# Patient Record
Sex: Female | Born: 1970
Health system: Southern US, Community
[De-identification: ages and names within clinical notes are randomized; demographics above are authoritative.]

## PROBLEM LIST (undated history)

## (undated) DIAGNOSIS — Z8619 Personal history of other infectious and parasitic diseases: Secondary | ICD-10-CM

## (undated) DIAGNOSIS — J45909 Unspecified asthma, uncomplicated: Secondary | ICD-10-CM

## (undated) DIAGNOSIS — D649 Anemia, unspecified: Secondary | ICD-10-CM

## (undated) DIAGNOSIS — N289 Disorder of kidney and ureter, unspecified: Secondary | ICD-10-CM

## (undated) DIAGNOSIS — R51 Headache: Secondary | ICD-10-CM

## (undated) DIAGNOSIS — E669 Obesity, unspecified: Secondary | ICD-10-CM

## (undated) DIAGNOSIS — T4145XA Adverse effect of unspecified anesthetic, initial encounter: Secondary | ICD-10-CM

## (undated) DIAGNOSIS — R519 Headache, unspecified: Secondary | ICD-10-CM

## (undated) DIAGNOSIS — T8859XA Other complications of anesthesia, initial encounter: Secondary | ICD-10-CM

## (undated) DIAGNOSIS — Z9289 Personal history of other medical treatment: Secondary | ICD-10-CM

## (undated) DIAGNOSIS — I10 Essential (primary) hypertension: Secondary | ICD-10-CM

## (undated) HISTORY — PX: CHOLECYSTECTOMY: SHX55

## (undated) HISTORY — DX: Headache, unspecified: R51.9

## (undated) HISTORY — DX: Anemia, unspecified: D64.9

## (undated) HISTORY — DX: Essential (primary) hypertension: I10

## (undated) HISTORY — PX: ABDOMINAL HYSTERECTOMY: SHX81

## (undated) HISTORY — DX: Personal history of other infectious and parasitic diseases: Z86.19

## (undated) HISTORY — PX: TUBAL LIGATION: SHX77

## (undated) HISTORY — DX: Obesity, unspecified: E66.9

## (undated) HISTORY — PX: HYSTEROSCOPY: SHX211

## (undated) HISTORY — DX: Headache: R51

---

## 1997-06-01 ENCOUNTER — Emergency Department (HOSPITAL_COMMUNITY): Admission: EM | Admit: 1997-06-01 | Discharge: 1997-06-01 | Payer: Self-pay | Admitting: Emergency Medicine

## 1998-02-27 ENCOUNTER — Emergency Department (HOSPITAL_COMMUNITY): Admission: EM | Admit: 1998-02-27 | Discharge: 1998-02-27 | Payer: Self-pay | Admitting: Emergency Medicine

## 1998-05-30 ENCOUNTER — Emergency Department (HOSPITAL_COMMUNITY): Admission: EM | Admit: 1998-05-30 | Discharge: 1998-05-30 | Payer: Self-pay | Admitting: Emergency Medicine

## 1998-05-30 ENCOUNTER — Encounter: Payer: Self-pay | Admitting: Emergency Medicine

## 2000-10-20 ENCOUNTER — Encounter: Payer: Self-pay | Admitting: Emergency Medicine

## 2000-10-20 ENCOUNTER — Emergency Department (HOSPITAL_COMMUNITY): Admission: EM | Admit: 2000-10-20 | Discharge: 2000-10-20 | Payer: Self-pay | Admitting: Emergency Medicine

## 2001-12-25 ENCOUNTER — Emergency Department (HOSPITAL_COMMUNITY): Admission: EM | Admit: 2001-12-25 | Discharge: 2001-12-25 | Payer: Self-pay | Admitting: Emergency Medicine

## 2002-04-08 ENCOUNTER — Ambulatory Visit (HOSPITAL_COMMUNITY): Admission: RE | Admit: 2002-04-08 | Discharge: 2002-04-08 | Payer: Self-pay | Admitting: General Surgery

## 2005-05-08 ENCOUNTER — Inpatient Hospital Stay (HOSPITAL_COMMUNITY): Admission: AD | Admit: 2005-05-08 | Discharge: 2005-05-08 | Payer: Self-pay | Admitting: Gynecology

## 2009-08-21 ENCOUNTER — Emergency Department (HOSPITAL_COMMUNITY): Admission: EM | Admit: 2009-08-21 | Discharge: 2009-08-21 | Payer: Self-pay | Admitting: Emergency Medicine

## 2009-11-13 ENCOUNTER — Ambulatory Visit: Payer: Self-pay | Admitting: Cardiology

## 2010-06-15 NOTE — H&P (Signed)
   NAME:  Destiny Delgado, Destiny Delgado NO.:  [id number]   MEDICAL RECORD NO.:  [id number]                   PATIENT TYPE:   LOCATION:                                       FACILITY:   PHYSICIAN:  Dalia Heading, M.D.               DATE OF BIRTH:  August 23, 1970   DATE OF ADMISSION:  04/08/2002  DATE OF DISCHARGE:                                HISTORY & PHYSICAL   CHIEF COMPLAINT:  Hematochezia, family history of colon carcinoma.   HISTORY OF PRESENT ILLNESS:  The patient is a 39 year old black female who  is referred for a colonoscopy.  Her brother was recently diagnosed with  colon cancer and she has a sister who recently underwent colonoscopy with  polypectomy.  She does have intermittent episodes of hematochezia.  She  denies any lightheadedness, weight loss, fever, abdominal pain, diarrhea, or  melena.  She has never had a colonoscopy.   PAST MEDICAL HISTORY:  1. Leg pain.  2. Urinary tract infections.  3. Extrinsic allergies.   PAST SURGICAL HISTORY:  Unremarkable.   CURRENT MEDICATIONS:  Vicodin, Bactrim, Altace.   ALLERGIES:  No known drug allergies.   REVIEW OF SYSTEMS:  Unremarkable.   PHYSICAL EXAMINATION:  GENERAL:  The patient is a well-developed, well-  nourished black female in no acute distress.  VITAL SIGNS:  She is afebrile and vital signs are stable.  LUNGS:  Clear to auscultation with equal breath sounds bilaterally.  HEART:  Reveals a regular rate and rhythm without S3, S4, or murmurs.  ABDOMEN:  Soft, nontender, nondistended.  No hepatosplenomegaly or masses or  noted.  RECTAL:  Deferred to the procedure.   IMPRESSION:  Hematochezia, family history of colon carcinoma.    PLAN:  The patient is scheduled for a colonoscopy on April 08, 2002.  The  risks and benefits of the procedure including bleeding and perforation were  fully explained to the patient, who gave informed consent.                                               Dalia Heading, M.D.    MAJ/MEDQ  D:  04/01/2002  T:  04/01/2002  Job:  [id number]   cc:   [facility]

## 2012-01-15 ENCOUNTER — Ambulatory Visit (INDEPENDENT_AMBULATORY_CARE_PROVIDER_SITE_OTHER): Payer: BC Managed Care – PPO

## 2012-01-15 ENCOUNTER — Ambulatory Visit (INDEPENDENT_AMBULATORY_CARE_PROVIDER_SITE_OTHER): Payer: BC Managed Care – PPO | Admitting: Obstetrics and Gynecology

## 2012-01-15 ENCOUNTER — Encounter: Payer: Self-pay | Admitting: Obstetrics and Gynecology

## 2012-01-15 ENCOUNTER — Other Ambulatory Visit: Payer: Self-pay

## 2012-01-15 ENCOUNTER — Other Ambulatory Visit: Payer: Self-pay | Admitting: Obstetrics and Gynecology

## 2012-01-15 VITALS — BP 124/76 | Ht 71.0 in | Wt 299.0 lb

## 2012-01-15 DIAGNOSIS — Z8619 Personal history of other infectious and parasitic diseases: Secondary | ICD-10-CM | POA: Insufficient documentation

## 2012-01-15 DIAGNOSIS — N926 Irregular menstruation, unspecified: Secondary | ICD-10-CM

## 2012-01-15 DIAGNOSIS — R519 Headache, unspecified: Secondary | ICD-10-CM | POA: Insufficient documentation

## 2012-01-15 DIAGNOSIS — R5383 Other fatigue: Secondary | ICD-10-CM

## 2012-01-15 DIAGNOSIS — I10 Essential (primary) hypertension: Secondary | ICD-10-CM | POA: Insufficient documentation

## 2012-01-15 LAB — PROLACTIN: Prolactin: 7.1 ng/mL

## 2012-01-15 LAB — FOLLICLE STIMULATING HORMONE: FSH: 6.5 m[IU]/mL

## 2012-01-15 LAB — CBC
HCT: 29.1 % — ABNORMAL LOW (ref 36.0–46.0)
MCH: 25.3 pg — ABNORMAL LOW (ref 26.0–34.0)
MCHC: 31.6 g/dL (ref 30.0–36.0)
MCV: 80.2 fL (ref 78.0–100.0)
RDW: 15.1 % (ref 11.5–15.5)

## 2012-01-15 LAB — THYROID PANEL WITH TSH: T4, Total: 6.8 ug/dL (ref 5.0–12.5)

## 2012-01-15 LAB — POCT URINE PREGNANCY: Preg Test, Ur: NEGATIVE

## 2012-01-15 MED ORDER — NORETHINDRONE ACETATE 5 MG PO TABS: 5.0000 mg | ORAL_TABLET | Freq: Every day | ORAL | Status: DC

## 2012-01-15 MED ORDER — KETOROLAC TROMETHAMINE 30 MG/ML IJ SOLN
30.0000 mg | Freq: Once | INTRAMUSCULAR | Status: AC
  Administered 2012-01-15: 30 mg via INTRAVENOUS

## 2012-01-15 MED ORDER — KETOROLAC TROMETHAMINE 10 MG PO TABS: 10.0000 mg | ORAL_TABLET | Freq: Four times a day (QID) | ORAL | Status: DC | PRN

## 2012-01-15 NOTE — Progress Notes (Signed)
Subjective:     Destiny Delgado is a 41 y.o. woman,No obstetric history on file., who presents for irregular menses referred by Carolyn Stare ( cousin).  Bleeding pattern started January 2013. Prior to January was monthly with normal flow and no BTB. Starting January, after losing father, cycle became irregular every 14 to 20 days, lasting up to 2 weeks, heavy for the whole duration, 1 pad every hour. Clots as large as 4 cm. Dysmenorrhea associated with passing clots, intensity 8/10. New onset also of Dyspareunia and PCB. Had Hysteroscopy with polypectomy and endometrial ablation in March 2013. No improvement. Received blood transfusion x 1 in March. Also had 1 month of BCP without success.Pts cycle has been very irregular and now it has been on for 3 weeks .  Denies any urinary tract symptoms, changes in bowel movements, nausea, vomiting or fever.   Current contraception: BTL  The following portions of the patient's history were reviewed and updated as appropriate: allergies, current medications, past family history, past medical history, past social history and past surgical history.  Review of Systems Pertinent items are noted in HPI.    Objective:    There were no vitals taken for this visit.  Weight:  Wt Readings from Last 1 Encounters:  No data found for Wt    BMI: There is no height or weight on file to calculate BMI.  General Appearance: Alert, appropriate appearance for age. No acute distress HEENT: Grossly normal Neck / Thyroid: Supple, no masses, nodes or enlargement Lungs: clear to auscultation bilaterally Back: No CVA tenderness Cardiovascular: Regular rate and rhythm. S1, S2, no murmur Gastrointestinal: Soft, non-tender, no masses or organomegaly Pelvic Exam: Vulva and vagina appear normal. Heavy bleeding +++ with large clots.Bimanual exam reveals normal uterus and adnexa. Rectovaginal: not indicated  Ultrasound: uterus 9.2 x 5.6 x 5.4 cm            Endometrium 7.71  mm   L ovary 2.02 x 1.33 x 1.29cm   R ovary 5.69 x 3.30 x 2.39 cm   Rt cyst 2.72 cm   Anteverted uterus, normal endometrium, normal left ovary, RTOV simple cyst, no fluid in CDS, normal adenexas.       Assessment:    DUB / menorrhagia refractory to trial of BCP and HSC/ablation   Plan:   CBC, Thyroid Panel with TSH, FSH, Prolactin Pelvic U/S : normal Toradol Inj  30 mg: Toradol 10 mg every 6 hours prn Aygestin 5 -10 mg daily  Obtain op / pathology report: follow-up after results  Reviewed possible options: Nuvaring / bcp, Mirena, robotic hysterectomy  Silverio Lay MD

## 2012-01-15 NOTE — Telephone Encounter (Signed)
Pharmacy didn't r/c rx. Resent by fax.  Pt to call if not able to get.  ld

## 2012-01-27 ENCOUNTER — Telehealth: Payer: Self-pay | Admitting: Obstetrics and Gynecology

## 2012-01-27 NOTE — Telephone Encounter (Signed)
sr pt 

## 2012-01-27 NOTE — Telephone Encounter (Addendum)
Can you please review pt lab work and advise if any recommendations?  Destiny Delgado, CMA  LVM to advise pt that I would call with results Darien Ramus, CMA

## 2012-01-29 DIAGNOSIS — Z9289 Personal history of other medical treatment: Secondary | ICD-10-CM

## 2012-01-29 HISTORY — DX: Personal history of other medical treatment: Z92.89

## 2012-02-03 NOTE — Telephone Encounter (Signed)
Needs follow up appt.

## 2012-02-04 NOTE — Telephone Encounter (Signed)
LVM to advise that per SR lab work was normal - Hemoglobin still shows anemia - TSH normal. Not menopausal. Does need f/u apt with SR  Darien Ramus, CMA

## 2012-02-07 ENCOUNTER — Encounter: Payer: Self-pay | Admitting: Obstetrics and Gynecology

## 2012-02-07 ENCOUNTER — Ambulatory Visit (INDEPENDENT_AMBULATORY_CARE_PROVIDER_SITE_OTHER): Payer: BC Managed Care – PPO | Admitting: Obstetrics and Gynecology

## 2012-02-07 VITALS — BP 114/66 | Ht 71.0 in | Wt 297.0 lb

## 2012-02-07 DIAGNOSIS — N898 Other specified noninflammatory disorders of vagina: Secondary | ICD-10-CM

## 2012-02-07 DIAGNOSIS — N939 Abnormal uterine and vaginal bleeding, unspecified: Secondary | ICD-10-CM

## 2012-02-07 LAB — POCT URINALYSIS DIPSTICK
Glucose, UA: NEGATIVE
Nitrite, UA: NEGATIVE
Urobilinogen, UA: NEGATIVE

## 2012-02-07 MED ORDER — DROSPIRENONE-ETHINYL ESTRADIOL 3-0.03 MG PO TABS: 1.0000 | ORAL_TABLET | Freq: Every day | ORAL | Status: DC

## 2012-02-07 NOTE — Progress Notes (Signed)
Subjective:     Destiny Delgado is a 42 y.o. woman,G4P4, who presents for irregular menses and lab results   Bleeding pattern: pt states still bleeding every day and flow is enough to cover the pad despite Aygestin 10 mg daily.  Labs: TSH, FSH and Prolactin normal. 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   Current contraception: none  The following portions of the patient's history were reviewed and updated as appropriate: allergies, current medications, past family history, past medical history, past social history and past surgical history.  Review of Systems Pertinent items are noted in HPI.    Objective:    BP 114/66  Ht 5\' 11"  (1.803 m)  Wt 297 lb (134.718 kg)  BMI 41.42 kg/m2  LMP 12/05/2011  Weight:  Wt Readings from Last 1 Encounters:  02/07/12 297 lb (134.718 kg)    BMI: Body mass index is 41.42 kg/(m^2).  General Appearance: Alert, appropriate appearance for age. No acute distress  Assessment:   Dysfunctional uterine bleeding  refractory to medical treatment and polypectomy    Plan:   Reviewed 2 options with patient:  Novasure  Procedure reviewed with option of in-office or in-hospital venue. Risks of bleeding, infection and injury to uterus +/- intra-abdominal organs discussed. Benefits include: 94 % success with approximately 30 % amenorrhea and fewer risks than intra-abdominal surgery.  Pre-op instructions not reviewed at this time  Robotically-assisted hysterectomy was reviewed with the patient.   Benefits of the robotic approach include lesser postoperative pain, less blood loss during surgery, reduced risk of injury to other organs due to better visualization with a 3-D HD 10 times magnifying camera, shorter  hospital stay between 0-1 night and rapid recovery with return to daily routine in 2-3 weeks. Although robotically-assisted hysterectomy has a longer operative time than traditional laparotomy, in a patient with good medical history, the benefits usually outweigh the risks.   Risks include bleeding, infection, injury to other organs, need for laparotomy, transient post-operative facial edema, increased risk of pelvic prolapse associated with any hysterectomy as well as earlier  onset of menopause. Preservation or preventative removal of the ovaries was also reviewed and left to the patient's discretion. Finally, the option of supracervical hysterectomy was also discussed with the possible but yet unconfirmed benefits of reduction of pelvic prolapse. If supracervical hysterectomy is performed, Pap smear screening would continue as currently recommended and a small but possible risk of cervical fibroid development is present.  All questions were answered.   The patient desires to discuss with her husband over the weekend and will call back with her decision. She was provided with written documentation.  Face-to-face 45 minutes    .  Silverio Lay MD

## 2012-02-07 NOTE — Addendum Note (Signed)
Addended by: Darien Ramus on: 02/07/2012 02:39 PM   Modules accepted: Orders

## 2012-02-10 ENCOUNTER — Telehealth: Payer: Self-pay

## 2012-02-10 NOTE — Telephone Encounter (Signed)
VM from pt states that she discussed options of surgeries with her husband and have decided that she is wanting hysterectomy, will send to [facility] for scheduling.  Darien Ramus, CMA

## 2012-02-11 ENCOUNTER — Telehealth: Payer: Self-pay | Admitting: Obstetrics and Gynecology

## 2012-02-13 ENCOUNTER — Other Ambulatory Visit: Payer: Self-pay | Admitting: Obstetrics and Gynecology

## 2012-02-17 ENCOUNTER — Encounter (HOSPITAL_COMMUNITY): Payer: Self-pay | Admitting: *Deleted

## 2012-02-17 ENCOUNTER — Telehealth: Payer: Self-pay

## 2012-02-17 ENCOUNTER — Observation Stay (HOSPITAL_COMMUNITY)
Admission: AD | Admit: 2012-02-17 | Discharge: 2012-02-19 | Disposition: A | Discharge status: Home / Self Care | Payer: BC Managed Care – PPO | Source: Ambulatory Visit | Attending: Obstetrics and Gynecology | Admitting: Obstetrics and Gynecology

## 2012-02-17 DIAGNOSIS — N920 Excessive and frequent menstruation with regular cycle: Principal | ICD-10-CM | POA: Insufficient documentation

## 2012-02-17 DIAGNOSIS — D500 Iron deficiency anemia secondary to blood loss (chronic): Secondary | ICD-10-CM | POA: Insufficient documentation

## 2012-02-17 LAB — CBC
MCV: 76.4 fL — ABNORMAL LOW (ref 78.0–100.0)
Platelets: 332 10*3/uL (ref 150–400)
RBC: 3.43 MIL/uL — ABNORMAL LOW (ref 3.87–5.11)
WBC: 9.3 10*3/uL (ref 4.0–10.5)

## 2012-02-17 LAB — COMPREHENSIVE METABOLIC PANEL
ALT: 20 U/L (ref 0–35)
Albumin: 3.4 g/dL — ABNORMAL LOW (ref 3.5–5.2)
Calcium: 8.9 mg/dL (ref 8.4–10.5)
GFR calc Af Amer: 90 mL/min — ABNORMAL LOW (ref >90)
Glucose, Bld: 79 mg/dL (ref 70–99)
Sodium: 137 mEq/L (ref 135–145)
Total Protein: 7 g/dL (ref 6.0–8.3)

## 2012-02-17 MED ORDER — SODIUM CHLORIDE 0.9 % IJ SOLN: 9.0000 mL | INTRAMUSCULAR | Status: DC | PRN

## 2012-02-17 MED ORDER — DEXTROSE 5 % IV SOLN
2.0000 g | INTRAVENOUS | Status: AC
  Filled 2012-02-17: qty 2

## 2012-02-17 MED ORDER — DIPHENHYDRAMINE HCL 50 MG/ML IJ SOLN: 12.5000 mg | Freq: Four times a day (QID) | INTRAMUSCULAR | Status: DC | PRN

## 2012-02-17 MED ORDER — DIPHENHYDRAMINE HCL 12.5 MG/5ML PO ELIX
12.5000 mg | ORAL_SOLUTION | Freq: Four times a day (QID) | ORAL | Status: DC | PRN
  Administered 2012-02-18 (×2): 12.5 mg via ORAL
  Filled 2012-02-17 (×2): qty 5

## 2012-02-17 MED ORDER — TEMAZEPAM 15 MG PO CAPS: 15.0000 mg | ORAL_CAPSULE | Freq: Every evening | ORAL | Status: DC | PRN

## 2012-02-17 MED ORDER — MENTHOL 3 MG MT LOZG: 1.0000 | LOZENGE | OROMUCOSAL | Status: DC | PRN

## 2012-02-17 MED ORDER — ONDANSETRON HCL 4 MG PO TABS
4.0000 mg | ORAL_TABLET | Freq: Four times a day (QID) | ORAL | Status: DC | PRN
  Administered 2012-02-19: 4 mg via ORAL
  Filled 2012-02-17: qty 1

## 2012-02-17 MED ORDER — ONDANSETRON HCL 4 MG/2ML IJ SOLN
4.0000 mg | Freq: Four times a day (QID) | INTRAMUSCULAR | Status: DC | PRN
  Administered 2012-02-18: 4 mg via INTRAVENOUS
  Filled 2012-02-17: qty 2

## 2012-02-17 MED ORDER — ONDANSETRON HCL 4 MG/2ML IJ SOLN: 4.0000 mg | Freq: Four times a day (QID) | INTRAMUSCULAR | Status: DC | PRN

## 2012-02-17 MED ORDER — ESTROGENS CONJUGATED 25 MG IJ SOLR
25.0000 mg | INTRAMUSCULAR | Status: DC
  Administered 2012-02-17 – 2012-02-18 (×5): 25 mg via INTRAVENOUS
  Filled 2012-02-17 (×13): qty 25

## 2012-02-17 MED ORDER — LISINOPRIL 10 MG PO TABS
10.0000 mg | ORAL_TABLET | Freq: Every day | ORAL | Status: DC
  Administered 2012-02-17 – 2012-02-19 (×2): 10 mg via ORAL
  Filled 2012-02-17 (×4): qty 1

## 2012-02-17 MED ORDER — NALOXONE HCL 0.4 MG/ML IJ SOLN: 0.4000 mg | INTRAMUSCULAR | Status: DC | PRN

## 2012-02-17 MED ORDER — IBUPROFEN 600 MG PO TABS
600.0000 mg | ORAL_TABLET | Freq: Four times a day (QID) | ORAL | Status: DC | PRN
  Administered 2012-02-19: 600 mg via ORAL
  Filled 2012-02-17: qty 1

## 2012-02-17 MED ORDER — FENTANYL 10 MCG/ML IV SOLN
INTRAVENOUS | Status: DC
  Administered 2012-02-17: 19:00:00 via INTRAVENOUS
  Administered 2012-02-17: 160 ug via INTRAVENOUS
  Administered 2012-02-18: 20 ug via INTRAVENOUS
  Administered 2012-02-18: 30 ug via INTRAVENOUS
  Administered 2012-02-18: 40 ug via INTRAVENOUS
  Administered 2012-02-19: 30 ug via INTRAVENOUS
  Administered 2012-02-19: 10 ug via INTRAVENOUS
  Filled 2012-02-17: qty 50

## 2012-02-17 MED ORDER — DEXTROSE IN LACTATED RINGERS 5 % IV SOLN
INTRAVENOUS | Status: DC
  Administered 2012-02-17 – 2012-02-19 (×4): via INTRAVENOUS

## 2012-02-17 NOTE — MAU Note (Signed)
CRNA coming to attempt IV start.

## 2012-02-17 NOTE — MAU Note (Signed)
Feeling dizzy and weak.  Been bleeding for "months now". Scheduled for surgery 02/20, hysterectomy.  Been having abd pain.  Called  Office was told to come in.

## 2012-02-17 NOTE — H&P (Signed)
  Reason for admission:   Menorrhagia  History:     Destiny Delgado is a 42 y.o. female, G4P4, presenting today c/o increased vaginal bleeding, dizziness and pelvic pain. Pain is 10/10 and is not relieved with Ibuprofen 800 mg.   Pt awaiting robotic hysterectomy in February. 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   Seen in the office 02/07/12 with c/o  bleeding every day and flow is enough to cover the pad despite Aygestin 10 mg daily. Was given Yasmin to tale TID for 3 days, BID for 3 days followed by daily. Used as prescribed until 2 days ago when she discontinued because of heavy bleeding.   Review of system:    Pertinent items in the HPI Cardiovascular: negative. No SOB. No cough GI: lack of appetite with occasional nausea Urinary: negative Extremities: denies swelling or pain   Past Medical History:   Past Medical History  Diagnosis Date  . Anemia   . Hypertension   . Head ache   . History of chicken pox   . Pelvic kidney     Allergies:   Allergies  Allergen Reactions  . Hydrocodone Nausea And Vomiting    Patient will not take this  . Codeine Rash    Social History:   History   Social History  . Marital Status: Married    Spouse Name: N/A    Number of Children: N/A  . Years of Education: N/A   Social History Main Topics  . Smoking status: Never Smoker   . Smokeless tobacco: Never Used  . Alcohol Use: No  . Drug Use: No  . Sexually Active: Yes -- Female partner(s)    Birth Control/ Protection: Surgical     Comment: BTL   Other Topics Concern  . None   Social History Narrative  . None    Family History:    Family History  Problem Relation Age of Onset  . Stroke Father   . Hypertension Father   . Diabetes Father   . Heart disease Mother   . Breast cancer Mother   . Thyroid disease Mother   . Anemia Mother   . Diabetes Mother   . Diabetes Brother   . Asthma Daughter   . Diabetes  Paternal Aunt   . Diabetes Maternal Aunt   . Diabetes Maternal Uncle   . Diabetes Paternal Uncle     Physical exam:    VS are normal with normal orthostatics General Appearance: Alert, appropriate appearance for age. Appears uncomfortable Neck / Thyroid: Supple, no masses, nodes or enlargement Lungs: clear to auscultation bilaterally Back: No CVA tenderness. Cardiovascular: Regular rate and rhythm. S1, S2, no murmur Gastrointestinal: Soft, non-tender, no masses or organomegaly Pelvic Exam: VV normal. Speculum exam with moderate bleeding and small clots. Uterus is normal size and NT. Adnexa not felt 2nd body habitus Extremities: do evidence of DVT  Ultrasound 01/14/13: essentially normal     Assessment:   Menorrhagia refractory to medical treatment   Plan:    Admit for IV Premarin and pain management

## 2012-02-17 NOTE — MAU Note (Signed)
IV attempt x 2, unsuccessful.  Deetta Perla RN in to attempt placement.

## 2012-02-17 NOTE — Progress Notes (Signed)
MD informed of pt status, ordered CBC & orthostatic VS, is coming to see pt.

## 2012-02-17 NOTE — MAU Note (Signed)
Assisted pt to rm via wc from triage.  Offered help with changing clothes.  Call light in reach.

## 2012-02-17 NOTE — Telephone Encounter (Signed)
Received VM from pt. Pt states that she is bleeding heavily x 1 1/2 weeks, having bad cramping. States that she is having clotting the size of 50 cent piece, changing pad every 20-30 minutes. Feeling extremely wee.   Per SR pt to come to hosp for eval. CBC and possible IV treatment  Pt voiced understanding  Darien Ramus, CMA

## 2012-02-18 ENCOUNTER — Encounter (HOSPITAL_COMMUNITY): Payer: Self-pay | Admitting: Registered Nurse

## 2012-02-18 LAB — CBC
Hemoglobin: 7.4 g/dL — ABNORMAL LOW (ref 12.0–15.0)
MCH: 23.6 pg — ABNORMAL LOW (ref 26.0–34.0)
MCHC: 30.5 g/dL (ref 30.0–36.0)
Platelets: 276 10*3/uL (ref 150–400)
RDW: 15.2 % (ref 11.5–15.5)

## 2012-02-18 LAB — COMPREHENSIVE METABOLIC PANEL
ALT: 18 U/L (ref 0–35)
AST: 13 U/L (ref 0–37)
Alkaline Phosphatase: 71 U/L (ref 39–117)
CO2: 26 mEq/L (ref 19–32)
Chloride: 102 mEq/L (ref 96–112)
GFR calc non Af Amer: 89 mL/min — ABNORMAL LOW (ref >90)
Glucose, Bld: 119 mg/dL — ABNORMAL HIGH (ref 70–99)
Potassium: 4.4 mEq/L (ref 3.5–5.1)
Sodium: 136 mEq/L (ref 135–145)
Total Bilirubin: 0.2 mg/dL — ABNORMAL LOW (ref 0.3–1.2)

## 2012-02-18 LAB — MRSA PCR SCREENING: MRSA by PCR: NEGATIVE

## 2012-02-18 LAB — PREPARE RBC (CROSSMATCH)

## 2012-02-18 MED ORDER — ACETAMINOPHEN 325 MG PO TABS
650.0000 mg | ORAL_TABLET | Freq: Once | ORAL | Status: AC
  Administered 2012-02-18: 650 mg via ORAL
  Filled 2012-02-18: qty 1

## 2012-02-18 MED ORDER — NORETHINDRONE-ETH ESTRADIOL 0.4-35 MG-MCG PO TABS
1.0000 | ORAL_TABLET | Freq: Every day | ORAL | Status: DC
  Administered 2012-02-18 – 2012-02-19 (×2): 1 via ORAL
  Filled 2012-02-18 (×3): qty 1

## 2012-02-18 MED ORDER — FUROSEMIDE 10 MG/ML IJ SOLN
20.0000 mg | Freq: Once | INTRAMUSCULAR | Status: DC
  Filled 2012-02-18: qty 2

## 2012-02-18 MED ORDER — DIPHENHYDRAMINE HCL 50 MG/ML IJ SOLN: 25.0000 mg | Freq: Once | INTRAMUSCULAR | Status: DC

## 2012-02-18 MED ORDER — SODIUM CHLORIDE 0.9 % IV SOLN: 500.0000 mL | Freq: Once | INTRAVENOUS | Status: DC

## 2012-02-18 MED ORDER — IBUPROFEN 600 MG PO TABS: 600.0000 mg | ORAL_TABLET | Freq: Four times a day (QID) | ORAL | Status: DC | PRN

## 2012-02-18 NOTE — Progress Notes (Addendum)
Destiny Delgado is a40 y.o.  [id number]  Hospital Day #2;  Menorrhagia and Severe Anemia  Subjective:   Patient is doing well except for complaints of a "hunger" headache and cramping when it is time to dose her medication.  Pain is well controlled with PCA-Fentanyl.  Voiding and passing flatus.  Felt a little off balance last night upon ambulating to bathroom (with assistance).   Objective: Vital signs in last 24 hours: Temp:  [98.1 F (36.7 C)-99.8 F (37.7 C)] 98.1 F (36.7 C) (01/21 0537) Pulse Rate:  [68-78] 68  (01/21 0537) Resp:  [16-23] 19  (01/21 0537) BP: (104-142)/(59-81) 104/64 mmHg (01/21 0537) SpO2:  [99 %-100 %] 100 % (01/21 0537) Weight:  [297 lb (134.718 kg)] 297 lb (134.718 kg) (01/20 1403)  Intake/Output from previous day: 01/20 0701 - 01/21 0700 In: 470 [P.O.:470] Out: 550 [Urine:550] Intake/Output this shift:    Lab 02/18/12 0535 02/17/12 1436  WBC 8.3 9.3  HGB 7.4* 8.3*  HCT 24.3* 26.2*  PLT 276 332     Lab 02/18/12 0535 02/17/12 1436  NA 136 137  K 4.4 4.4  CL 102 104  CO2 26 25  BUN 8 9  CREATININE 0.81 0.91  CALCIUM 8.6 8.9  PROT 6.6 7.0  BILITOT 0.2* 0.2*  ALKPHOS 71 84  ALT 18 20  AST 13 15  GLUCOSE 119* 79    EXAM: General: alert, cooperative and no distress Resp: clear to auscultation bilaterally Cardio: regular rate and rhythm, S1, S2 normal, no murmur, click, rub or gallop GI: soft, bowel sounds present, diffusely tender without guarding. Extremities: SCD hose in place and functiioning with no calf tenderness. Vaginal Bleeding: a 4 inch stain of blood in pad; no clots   Assessment: Menorrhagia Severe Anemia  Plan: Transfuse 2 Units of PRBCs Continue IV Premarin Routine Care  LOS: 1 day    POWELL,ELMIRA, PA-C 02/18/2012 8:07 AM   Pt feeling better. Nausea resolved. Ate her full meal without difficulty. Pain is also very improved with menstrual bleeding now very light. Will transfuse a total of 3 units and plan  D/C in am

## 2012-02-19 LAB — CBC
Hemoglobin: 9.9 g/dL — ABNORMAL LOW (ref 12.0–15.0)
MCH: 25.8 pg — ABNORMAL LOW (ref 26.0–34.0)
MCHC: 32.8 g/dL (ref 30.0–36.0)
MCV: 78.6 fL (ref 78.0–100.0)
RBC: 3.84 MIL/uL — ABNORMAL LOW (ref 3.87–5.11)

## 2012-02-19 LAB — TYPE AND SCREEN: Antibody Screen: NEGATIVE

## 2012-02-19 MED ORDER — PROMETHAZINE HCL 25 MG PO TABS: 25.0000 mg | ORAL_TABLET | Freq: Four times a day (QID) | ORAL | Status: DC | PRN

## 2012-02-19 MED ORDER — IRON POLYSACCH CMPLX-B12-FA 150-0.025-1 MG PO CAPS: 1.0000 | ORAL_CAPSULE | Freq: Two times a day (BID) | ORAL | Status: DC

## 2012-02-19 MED ORDER — TRAMADOL HCL 50 MG PO TABS
100.0000 mg | ORAL_TABLET | Freq: Four times a day (QID) | ORAL | Status: DC
  Administered 2012-02-19 (×2): 100 mg via ORAL
  Filled 2012-02-19 (×2): qty 2

## 2012-02-19 MED ORDER — NORETHINDRONE-ETH ESTRADIOL 0.4-35 MG-MCG PO TABS: ORAL_TABLET | ORAL | Status: DC

## 2012-02-19 MED ORDER — IBUPROFEN 600 MG PO TABS: ORAL_TABLET | ORAL | Status: DC

## 2012-02-19 MED ORDER — PROMETHAZINE HCL 25 MG PO TABS
25.0000 mg | ORAL_TABLET | Freq: Four times a day (QID) | ORAL | Status: DC | PRN
  Administered 2012-02-19: 25 mg via ORAL
  Filled 2012-02-19: qty 1

## 2012-02-19 MED ORDER — ONDANSETRON HCL 4 MG PO TABS: 4.0000 mg | ORAL_TABLET | Freq: Four times a day (QID) | ORAL | Status: DC | PRN

## 2012-02-19 MED ORDER — HYDROMORPHONE HCL 2 MG PO TABS
2.0000 mg | ORAL_TABLET | ORAL | Status: DC | PRN
  Filled 2012-02-19: qty 1

## 2012-02-19 MED ORDER — TRAMADOL HCL 50 MG PO TABS: ORAL_TABLET | ORAL | Status: DC

## 2012-02-19 MED ORDER — HYDROMORPHONE HCL 2 MG PO TABS: 2.0000 mg | ORAL_TABLET | Freq: Four times a day (QID) | ORAL | Status: DC | PRN

## 2012-02-19 NOTE — Progress Notes (Signed)
Patient discharged home with significant other.  Patient verbalized understanding of discharged instructions.  Prescriptions given and reviewed with patient and significant other.  Patient stated still little dizzy with ambulation, denies any vaginal bleeding at this time, denies any pain and tolerated 1/2 of grilled cheese sandwich and little soup.  Discharged to car via wheelchair.

## 2012-02-19 NOTE — Discharge Summary (Signed)
  Physician Discharge Summary  Patient ID: DELAYNEE ALRED MRN: [id number] DOB/AGE: 1970/05/05 42 y.o.  Admit date: 02/17/2012 Discharge date: 02/19/2012   Discharge Diagnoses:  Menorrhagia and Severe Anemia Active Problems:  * No active hospital problems. *    Discharged Condition: Good and Improved-Stable  Hospital Course: Patient was admitted for menorrhagia that caused her hemoglobin to drop to  7.4 and was accompanied by orthostatic changes.  She was placed on IV Premarin until bleeding subsided and was transfused 3 units of packed red blood cells.  On hospital day #2 she was no longer symptomatic with her anemia, had a hemoglobin of 9.9 and had only scant bleeding. It was at this time however that patient had difficulty managing her pelvic cramping and was intolerant of her oral analgesia.  By hospital day #4  she was able to tolerate Dilaudid and had  achieved the maximum benefit of her hospital stay.  The patient was discharged home on Ovcon 35 daily, Dilaudid  and iron supplementation.   Disposition: Home to self care  Discharge Medications:   Gypsy, Kellogg  Home Medication Instructions [id number]   Printed on:02/19/12 0810  Medication Information                    lisinopril (PRINIVIL,ZESTRIL) 10 MG tablet Take 10 mg by mouth daily.           Multiple Vitamins-Minerals (ALIVE ONCE DAILY WOMENS 50+) TABS Take 1 tablet by mouth daily.           ibuprofen (ADVIL,MOTRIN) 600 MG tablet 1 po pc every 6 hours for 3 days then as needed for pain           norethindrone-ethinyl estradiol (OVCON-35,BALZIVA,BRIELLYN) 0.4-35 MG-MCG tablet 1 active tablet daily (do not take sugar pills) continuously           ondansetron (ZOFRAN) 4 MG tablet Take 1 tablet (4 mg total) by mouth every 6 (six) hours as needed for nausea.           traMADol (ULTRAM) 50 MG tablet 1-2 tablets every 6 hours as needed for severe pain           Iron Polysacch Cmplx-B12-FA 150-0.025-1 MG  CAPS Take 1 capsule by mouth 2 (two) times daily.             Tramadol was replaced by oral Dilaudid due to the patient's intolerance of Tramadol.  Follow-up: March 04, 2012 at 10:30 a.m. with Dr. Estanislado Pandy   Signed: Henreitta Leber, PA-C 02/19/2012, 8:10 AM

## 2012-02-19 NOTE — Progress Notes (Signed)
Ur chart review completed.  

## 2012-02-19 NOTE — Progress Notes (Addendum)
Destiny Delgado is a72 y.o.  [id number]  Post Op Day #2  Subjective: Patient is feeling much better, she says though she still has some cramping.  She reports almost no bleeding at all on her pad, just a faint stain. Denies any more nausea, headache or dizziness with ambulation.  Tolerating a regular diet and is voiding without difficulty.  Had a BM yesterday.  Objective: Vital signs in last 24 hours: Temp:  [97.3 F (36.3 C)-98.8 F (37.1 C)] 97.3 F (36.3 C) (01/22 0536) Pulse Rate:  [60-82] 66  (01/22 0536) Resp:  [16-25] 16  (01/22 0536) BP: (95-117)/(5-78) 107/66 mmHg (01/22 0536) SpO2:  [94 %-100 %] 100 % (01/22 0536) Weight:  [297 lb (134.718 kg)] 297 lb (134.718 kg) (01/21 0813)  Intake/Output from previous day: 01/21 0701 - 01/22 0700 In: 4444.2 [P.O.:240; I.V.:3154.2] Out: 1451 [Urine:1450] Intake/Output this shift:    Lab 02/19/12 0115 02/18/12 0535 02/17/12 1436  WBC 10.6* 8.3 9.3  HGB 9.9* 7.4* 8.3*  HCT 30.2* 24.3* 26.2*  PLT 272 276 332     Lab 02/18/12 0535 02/17/12 1436  NA 136 137  K 4.4 4.4  CL 102 104  CO2 26 25  BUN 8 9  CREATININE 0.81 0.91  CALCIUM 8.6 8.9  PROT 6.6 7.0  BILITOT 0.2* 0.2*  ALKPHOS 71 84  ALT 18 20  AST 13 15  GLUCOSE 119* 79    EXAM: General: alert, cooperative and no distress Resp: clear to auscultation bilaterally Cardio: regular rate and rhythm, S1, S2 normal, no murmur, click, rub or gallop GI: soft, BS present, mild diffuse tenderness without guarding or rebound. Extremities: No calf tenderness Vaginal Bleeding: Scant, faint stain.   Assessment:  Menorrhagia and Severe Anemia (S/P Transfusion 3 U PRBC)  Plan: Discharge home To place on iron supplementation and Ovcon 35 (active pills only) in preparation for surgery in February.  LOS: 2 days    POWELL,ELMIRA, PA-C 02/19/2012 7:51 AM  Called by RN because pt did not tolerate ultram and felt nausea.  Phenergan ordered.  Will do a trial of dilaudid prior to  d/c home and if tolerates it will go home with dilaudid.  Will also give Rx for phenergan.

## 2012-02-19 NOTE — Progress Notes (Signed)
02/19/12 1100  Clinical Encounter Type  Visited With Patient and family together (Husband Christen Bame, who goes by Thayer Ohm)  Visit Type Initial;Spiritual support;Social support  Referral From Nurse  Recommendations Spiritual Care plans to follow up w/ pt on 2/20 (surgery day)  Spiritual Encounters  Spiritual Needs Emotional;Prayer    Missed Tineka yesterday, so had initial visit today with her and husband Thayer Ohm.  They were very receptive to chaplain support and valued opportunity for theological reflection, prayer, and sharing.  Giara is in better spirits with a bit more energy as she awaits discharge.  Provided pastoral presence, listening, witness to family's story, and prayer at bedside.  Will put Liesel's 2/20 surgery on chaplains' calendar for follow-up support.  Family is aware of chaplain availability and welcomes further care.  [address] [place], [place] [phone]

## 2012-02-24 ENCOUNTER — Telehealth: Payer: Self-pay | Admitting: Obstetrics and Gynecology

## 2012-02-24 ENCOUNTER — Telehealth: Payer: Self-pay

## 2012-02-24 ENCOUNTER — Other Ambulatory Visit: Payer: Self-pay | Admitting: Obstetrics and Gynecology

## 2012-02-24 MED ORDER — ONDANSETRON HCL 4 MG PO TABS: 4.0000 mg | ORAL_TABLET | Freq: Three times a day (TID) | ORAL | Status: DC | PRN

## 2012-02-24 MED ORDER — NORETHINDRONE-ETH ESTRADIOL 0.4-35 MG-MCG PO TABS: ORAL_TABLET | ORAL | Status: DC

## 2012-02-24 NOTE — Telephone Encounter (Signed)
TC from pt stating that Rx for Ovcon and Zofran was sent to pharmacy but she is having difficulty getting medication. After speaking with pharmacy [facility] [phone] was advised that they would contact ins regarding getting override for pt Ovcon since pt is needing to take this three times daily. Zofran is needing PA pharmacy will fax over information and I will contact ins for PA  Pt aware Will call pt when all is taken care of  Darien Ramus, CMA

## 2012-02-24 NOTE — Telephone Encounter (Signed)
TC from pharmacy. Override for Ovcon was approved.  Waiting on ins to approve PA. Advised pt this may take up to 72 hours. Pt has questions regarding any OTC meds she can take while waiting.  Advised pt to speak with Pharmacist and see if there is anything they recommend.  Pt voiced understanding  Darien Ramus, CMA

## 2012-02-24 NOTE — Telephone Encounter (Signed)
Zofran PA denied by ins. Per EP call in Phenergan 12.5 mg q6hrs prn # 30 x 0. Per pharmacy pt ins will cover this med.   Pt aware of change.  Pt also is stating that she is needing a letter for her job stating that due to condition it is necessary for her to be out of work intermittently until her surgery. States that she needs this letter to date from last week - surgery date. Needing this faxed to [phone]  Darien Ramus, CMA

## 2012-02-24 NOTE — Telephone Encounter (Signed)
Call from patient who was discharged for [facility] last Thursday due to menorrhagia (transfused 3 units of PRBCs and given IV Premarin) who states that she started bleeding on Friday and has progressively increased since then.  Passing large clots, soiling clothes (especially if she sneezes or coughs) and changes pad every 30 minutes. States that cramping has resumed.  Did not get her medications (birth control pills and pain medicines as she states that her pharmacy didn't have the prescriptions.  States she's had problems with the pharmacy before. She also did not call because she didn't know that there was someone on call at all times and that the office was closed.  Patient states that she was told that she would need a D & C if her bleeding did not stop.  Since she has been on different BCPs before, she doesn't have any faith in the birth control pills stopping her bleeding and wants another option.  To contact Dr. Estanislado Pandy or the M.D. on call for management plan.  Patient was agreeable.  Kamarri Lovvorn, PA-C

## 2012-02-24 NOTE — Telephone Encounter (Signed)
Call back to patient, after consultation with Dr. Pennie Rushing about patient's bleeding, with  the recommendation that the patient  get her BCPs (Ovcon 35) and  take them 3 times a day x 7 days the 1 po daily.  Left voicemail message for patient with these instructions.  New prescription for Ovcon 35 and Zofran 4 mg called to [facility] [place] [facility] [address] per patient's instructions. Gokul Waybright, PA-C    Return call from patient outlining the instructions from above-patient agreeable.  Aradhana Gin, PA-C

## 2012-02-24 NOTE — Telephone Encounter (Signed)
Robotic Hysterectomy scheduled for 03/19/12 @ 8:30 with SR/EP.  BCBS effective 11/29/2011.  Plan pays 80/20 after a $2,000 Deductible. Pre-op due $700; Patient to pay $350 before surgery. -Adrianne Pridgen

## 2012-02-25 ENCOUNTER — Other Ambulatory Visit: Payer: Self-pay | Admitting: Obstetrics and Gynecology

## 2012-02-26 ENCOUNTER — Encounter (HOSPITAL_COMMUNITY)
Admission: AD | Disposition: A | Discharge status: Home / Self Care | Payer: Self-pay | Source: Ambulatory Visit | Attending: Obstetrics and Gynecology

## 2012-02-26 ENCOUNTER — Encounter (HOSPITAL_COMMUNITY): Payer: Self-pay

## 2012-02-26 ENCOUNTER — Encounter (HOSPITAL_COMMUNITY): Payer: Self-pay | Admitting: Anesthesiology

## 2012-02-26 ENCOUNTER — Observation Stay (HOSPITAL_COMMUNITY): Payer: BC Managed Care – PPO | Admitting: Anesthesiology

## 2012-02-26 ENCOUNTER — Ambulatory Visit: Admit: 2012-02-26 | Payer: Self-pay | Admitting: Obstetrics and Gynecology

## 2012-02-26 ENCOUNTER — Encounter: Payer: BC Managed Care – PPO | Admitting: Obstetrics and Gynecology

## 2012-02-26 ENCOUNTER — Ambulatory Visit (HOSPITAL_COMMUNITY)
Admission: AD | Admit: 2012-02-26 | Discharge: 2012-02-26 | Disposition: A | Discharge status: Home / Self Care | Payer: BC Managed Care – PPO | Source: Ambulatory Visit | Attending: Obstetrics and Gynecology | Admitting: Obstetrics and Gynecology

## 2012-02-26 DIAGNOSIS — D649 Anemia, unspecified: Secondary | ICD-10-CM | POA: Insufficient documentation

## 2012-02-26 DIAGNOSIS — I10 Essential (primary) hypertension: Secondary | ICD-10-CM

## 2012-02-26 DIAGNOSIS — N920 Excessive and frequent menstruation with regular cycle: Secondary | ICD-10-CM

## 2012-02-26 DIAGNOSIS — N949 Unspecified condition associated with female genital organs and menstrual cycle: Secondary | ICD-10-CM | POA: Insufficient documentation

## 2012-02-26 DIAGNOSIS — R519 Headache, unspecified: Secondary | ICD-10-CM

## 2012-02-26 DIAGNOSIS — Z8619 Personal history of other infectious and parasitic diseases: Secondary | ICD-10-CM

## 2012-02-26 DIAGNOSIS — N938 Other specified abnormal uterine and vaginal bleeding: Secondary | ICD-10-CM | POA: Insufficient documentation

## 2012-02-26 HISTORY — PX: DILITATION & CURRETTAGE/HYSTROSCOPY WITH THERMACHOICE ABLATION: SHX5569

## 2012-02-26 LAB — CBC
HCT: 31.9 % — ABNORMAL LOW (ref 36.0–46.0)
Hemoglobin: 10 g/dL — ABNORMAL LOW (ref 12.0–15.0)
MCH: 25.2 pg — ABNORMAL LOW (ref 26.0–34.0)
MCHC: 31.3 g/dL (ref 30.0–36.0)
MCV: 80.4 fL (ref 78.0–100.0)
RDW: 16.7 % — ABNORMAL HIGH (ref 11.5–15.5)

## 2012-02-26 LAB — COMPREHENSIVE METABOLIC PANEL
ALT: 18 U/L (ref 0–35)
AST: 13 U/L (ref 0–37)
CO2: 27 mEq/L (ref 19–32)
Calcium: 9 mg/dL (ref 8.4–10.5)
Chloride: 101 mEq/L (ref 96–112)
Creatinine, Ser: 0.9 mg/dL (ref 0.50–1.10)
GFR calc Af Amer: >90 mL/min (ref >90)
GFR calc non Af Amer: 78 mL/min — ABNORMAL LOW (ref >90)
Glucose, Bld: 132 mg/dL — ABNORMAL HIGH (ref 70–99)
Total Bilirubin: 0.3 mg/dL (ref 0.3–1.2)

## 2012-02-26 SURGERY — DILATATION & CURETTAGE/HYSTEROSCOPY WITH THERMACHOICE ABLATION
Anesthesia: General | Site: Vagina | Wound class: Clean Contaminated

## 2012-02-26 MED ORDER — FENTANYL CITRATE 0.05 MG/ML IJ SOLN
INTRAMUSCULAR | Status: AC
  Filled 2012-02-26: qty 2

## 2012-02-26 MED ORDER — DEXAMETHASONE SODIUM PHOSPHATE 4 MG/ML IJ SOLN: 8.0000 mg | Freq: Once | INTRAMUSCULAR | Status: DC | PRN

## 2012-02-26 MED ORDER — ONDANSETRON HCL 4 MG/2ML IJ SOLN: 8.0000 mg | Freq: Once | INTRAMUSCULAR | Status: DC

## 2012-02-26 MED ORDER — ONDANSETRON HCL 4 MG/2ML IJ SOLN
INTRAMUSCULAR | Status: AC
  Filled 2012-02-26: qty 4

## 2012-02-26 MED ORDER — FENTANYL CITRATE 0.05 MG/ML IJ SOLN
INTRAMUSCULAR | Status: DC | PRN
  Administered 2012-02-26: 100 ug via INTRAVENOUS

## 2012-02-26 MED ORDER — MIDAZOLAM HCL 5 MG/5ML IJ SOLN
INTRAMUSCULAR | Status: DC | PRN
  Administered 2012-02-26: 2 mg via INTRAVENOUS

## 2012-02-26 MED ORDER — FENTANYL CITRATE 0.05 MG/ML IJ SOLN
INTRAMUSCULAR | Status: AC
  Administered 2012-02-26: 50 ug via INTRAVENOUS
  Filled 2012-02-26: qty 2

## 2012-02-26 MED ORDER — DEXAMETHASONE SODIUM PHOSPHATE 10 MG/ML IJ SOLN
INTRAMUSCULAR | Status: AC
  Filled 2012-02-26: qty 1

## 2012-02-26 MED ORDER — HYDROMORPHONE HCL 2 MG PO TABS: 2.0000 mg | ORAL_TABLET | ORAL | Status: DC | PRN

## 2012-02-26 MED ORDER — FENTANYL CITRATE 0.05 MG/ML IJ SOLN
25.0000 ug | INTRAMUSCULAR | Status: DC | PRN
  Administered 2012-02-26 (×3): 50 ug via INTRAVENOUS

## 2012-02-26 MED ORDER — SIMETHICONE 80 MG PO CHEW
80.0000 mg | CHEWABLE_TABLET | Freq: Four times a day (QID) | ORAL | Status: DC | PRN
  Filled 2012-02-26: qty 1

## 2012-02-26 MED ORDER — KETOROLAC TROMETHAMINE 30 MG/ML IJ SOLN: 15.0000 mg | Freq: Once | INTRAMUSCULAR | Status: DC | PRN

## 2012-02-26 MED ORDER — LIDOCAINE HCL (CARDIAC) 20 MG/ML IV SOLN
INTRAVENOUS | Status: DC | PRN
  Administered 2012-02-26: 100 mg via INTRAVENOUS

## 2012-02-26 MED ORDER — FENTANYL CITRATE 0.05 MG/ML IJ SOLN
25.0000 ug | INTRAMUSCULAR | Status: DC | PRN
  Administered 2012-02-26 (×4): 50 ug via INTRAVENOUS

## 2012-02-26 MED ORDER — DEXTROSE 5 % IV SOLN
INTRAVENOUS | Status: DC | PRN
  Administered 2012-02-26: 1000 mL

## 2012-02-26 MED ORDER — ONDANSETRON HCL 4 MG/2ML IJ SOLN: 4.0000 mg | Freq: Four times a day (QID) | INTRAMUSCULAR | Status: DC | PRN

## 2012-02-26 MED ORDER — LIDOCAINE HCL 2 % IJ SOLN
INTRAMUSCULAR | Status: AC
  Filled 2012-02-26: qty 20

## 2012-02-26 MED ORDER — KETOROLAC TROMETHAMINE 30 MG/ML IJ SOLN
30.0000 mg | Freq: Four times a day (QID) | INTRAMUSCULAR | Status: DC
  Administered 2012-02-26: 30 mg via INTRAVENOUS

## 2012-02-26 MED ORDER — LACTATED RINGERS IV SOLN
INTRAVENOUS | Status: DC | PRN
  Administered 2012-02-26: 17:00:00 via INTRAVENOUS

## 2012-02-26 MED ORDER — ONDANSETRON HCL 4 MG/2ML IJ SOLN
INTRAMUSCULAR | Status: AC
  Filled 2012-02-26: qty 2

## 2012-02-26 MED ORDER — DEXTROSE IN LACTATED RINGERS 5 % IV SOLN
INTRAVENOUS | Status: DC | PRN
  Administered 2012-02-26: 16:00:00 via INTRAVENOUS

## 2012-02-26 MED ORDER — ONDANSETRON HCL 4 MG/2ML IJ SOLN
INTRAMUSCULAR | Status: DC | PRN
  Administered 2012-02-26: 4 mg via INTRAVENOUS

## 2012-02-26 MED ORDER — ACETAMINOPHEN 10 MG/ML IV SOLN
1000.0000 mg | Freq: Four times a day (QID) | INTRAVENOUS | Status: DC
  Administered 2012-02-26: 1000 mg via INTRAVENOUS
  Filled 2012-02-26: qty 100

## 2012-02-26 MED ORDER — DEXTROSE IN LACTATED RINGERS 5 % IV SOLN
INTRAVENOUS | Status: DC
  Administered 2012-02-26 (×2): via INTRAVENOUS

## 2012-02-26 MED ORDER — PROPOFOL 10 MG/ML IV EMUL
INTRAVENOUS | Status: DC | PRN
  Administered 2012-02-26: 200 mg via INTRAVENOUS

## 2012-02-26 MED ORDER — ESTROGENS CONJUGATED 25 MG IJ SOLR
25.0000 mg | Freq: Once | INTRAMUSCULAR | Status: AC
  Administered 2012-02-26: 25 mg via INTRAVENOUS
  Filled 2012-02-26: qty 25

## 2012-02-26 MED ORDER — SILVER NITRATE-POT NITRATE 75-25 % EX MISC
CUTANEOUS | Status: AC
  Filled 2012-02-26: qty 2

## 2012-02-26 MED ORDER — MIDAZOLAM HCL 2 MG/2ML IJ SOLN
INTRAMUSCULAR | Status: AC
  Filled 2012-02-26: qty 2

## 2012-02-26 MED ORDER — KETOROLAC TROMETHAMINE 30 MG/ML IJ SOLN: 30.0000 mg | Freq: Four times a day (QID) | INTRAMUSCULAR | Status: DC

## 2012-02-26 MED ORDER — PRENATAL MULTIVITAMIN CH: 1.0000 | ORAL_TABLET | Freq: Every day | ORAL | Status: DC

## 2012-02-26 MED ORDER — ONDANSETRON HCL 4 MG PO TABS: 4.0000 mg | ORAL_TABLET | Freq: Four times a day (QID) | ORAL | Status: DC | PRN

## 2012-02-26 MED ORDER — MEPERIDINE HCL 25 MG/ML IJ SOLN: 6.2500 mg | INTRAMUSCULAR | Status: DC | PRN

## 2012-02-26 MED ORDER — ONDANSETRON 8 MG/NS 50 ML IVPB
8.0000 mg | Freq: Once | INTRAVENOUS | Status: AC
  Administered 2012-02-26: 8 mg via INTRAVENOUS
  Filled 2012-02-26: qty 8

## 2012-02-26 MED ORDER — LIDOCAINE HCL (CARDIAC) 20 MG/ML IV SOLN
INTRAVENOUS | Status: AC
  Filled 2012-02-26: qty 5

## 2012-02-26 MED ORDER — KETOROLAC TROMETHAMINE 30 MG/ML IJ SOLN
INTRAMUSCULAR | Status: AC
  Administered 2012-02-26: 30 mg via INTRAVENOUS
  Filled 2012-02-26: qty 1

## 2012-02-26 MED ORDER — PROPOFOL 10 MG/ML IV EMUL
INTRAVENOUS | Status: AC
  Filled 2012-02-26: qty 20

## 2012-02-26 MED ORDER — DEXAMETHASONE SODIUM PHOSPHATE 4 MG/ML IJ SOLN
INTRAMUSCULAR | Status: DC | PRN
  Administered 2012-02-26: 10 mg via INTRAVENOUS

## 2012-02-26 MED ORDER — LIDOCAINE HCL 2 % IJ SOLN
INTRAMUSCULAR | Status: DC | PRN
  Administered 2012-02-26: 20 mL

## 2012-02-26 SURGICAL SUPPLY — 13 items
CANISTER SUCTION 2500CC (MISCELLANEOUS) ×2 IMPLANT
CATH ROBINSON RED A/P 16FR (CATHETERS) ×2 IMPLANT
CATH THERMACHOICE III (CATHETERS) ×2 IMPLANT
CLOTH BEACON ORANGE TIMEOUT ST (SAFETY) ×2 IMPLANT
CONTAINER PREFILL 10% NBF 60ML (FORM) ×4 IMPLANT
DRESSING TELFA 8X3 (GAUZE/BANDAGES/DRESSINGS) ×2 IMPLANT
GLOVE BIO SURGEON STRL SZ 6.5 (GLOVE) ×4 IMPLANT
GOWN STRL REIN XL XLG (GOWN DISPOSABLE) ×4 IMPLANT
PACK HYSTEROSCOPY LF (CUSTOM PROCEDURE TRAY) ×2 IMPLANT
PAD OB MATERNITY 4.3X12.25 (PERSONAL CARE ITEMS) ×2 IMPLANT
SYR 20CC LL (SYRINGE) ×2 IMPLANT
TOWEL OR 17X24 6PK STRL BLUE (TOWEL DISPOSABLE) ×4 IMPLANT
WATER STERILE IRR 1000ML POUR (IV SOLUTION) ×2 IMPLANT

## 2012-02-26 NOTE — Anesthesia Postprocedure Evaluation (Signed)
Anesthesia Post Note  Patient: Destiny Delgado  Procedure(s) Performed: Procedure(s) (LRB): DILATATION & CURETTAGE/HYSTEROSCOPY WITH THERMACHOICE ABLATION (N/A)  Anesthesia type: General  Patient location: PACU  Post pain: Pain level controlled  Post assessment: Post-op Vital signs reviewed  Last Vitals:  Filed Vitals:   02/26/12 1730  BP: 138/82  Pulse: 72  Temp:   Resp: 18    Post vital signs: Reviewed  Level of consciousness: sedated  Complications: No apparent anesthesia complications

## 2012-02-26 NOTE — MAU Note (Signed)
Patient states she has recently been admitted and given a transfusion for excessive vaginal bleeding. States she is scheduled for a hysterectomy on 2-20. State she has been taking BCP's to help the bleeding but started bleeding again on 1-23 and is passing clots and having a lot of cramping.

## 2012-02-26 NOTE — Anesthesia Procedure Notes (Signed)
Procedure Name: LMA Insertion Date/Time: 02/26/2012 4:35 PM Performed by: Bella Brummet, Jannet Askew Pre-anesthesia Checklist: Patient identified, Patient being monitored, Emergency Drugs available, Timeout performed and Suction available Patient Re-evaluated:Patient Re-evaluated prior to inductionOxygen Delivery Method: Circle system utilized Preoxygenation: Pre-oxygenation with 100% oxygen Intubation Type: IV induction Ventilation: Mask ventilation without difficulty LMA: LMA inserted LMA Size: 4.0 Number of attempts: 1 Dental Injury: Teeth and Oropharynx as per pre-operative assessment

## 2012-02-26 NOTE — Anesthesia Preprocedure Evaluation (Signed)
Anesthesia Evaluation  Patient identified by MRN, date of birth, ID band Patient awake    Reviewed: Allergy & Precautions, H&P , NPO status , Patient's Chart, lab work & pertinent test results  Airway Mallampati: III TM Distance: <3 FB Neck ROM: full    Dental No notable dental hx. (+) Teeth Intact   Pulmonary neg pulmonary ROS,    Pulmonary exam normal       Cardiovascular hypertension, Pt. on medications     Neuro/Psych negative psych ROS   GI/Hepatic negative GI ROS, Neg liver ROS,   Endo/Other  Morbid obesity  Renal/GU negative Renal ROS  negative genitourinary   Musculoskeletal negative musculoskeletal ROS (+)   Abdominal (+) + obese,   Peds negative pediatric ROS (+)  Hematology negative hematology ROS (+)   Anesthesia Other Findings   Reproductive/Obstetrics negative OB ROS                           Anesthesia Physical Anesthesia Plan  ASA: III  Anesthesia Plan: General   Post-op Pain Management:    Induction: Intravenous  Airway Management Planned: LMA  Additional Equipment:   Intra-op Plan:   Post-operative Plan:   Informed Consent: I have reviewed the patients History and Physical, chart, labs and discussed the procedure including the risks, benefits and alternatives for the proposed anesthesia with the patient or authorized representative who has indicated his/her understanding and acceptance.     Plan Discussed with: CRNA and Surgeon  Anesthesia Plan Comments: (Patient has OSA by Stop-Bang criteria.)        Anesthesia Quick Evaluation

## 2012-02-26 NOTE — H&P (Signed)
Menorrhagia  History:   Destiny Delgado is a 42 y.o. female, G4P4, presenting today c/o increased vaginal bleeding, dizziness and pelvic pain.pt was hospitlized last week and given IV premarin for DUB.  She is now taking a taper of OCPs.  She has been taking these for three days.  She did not start immediately after discharge as directed.   She has cramping when she passes large clots.  The pt and her husband claims she has been soaking 2-3 pads per hour and bleeding on blood sheets.   Pt awaiting robotic hysterectomy in February. 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  Seen in the office 02/07/12 with c/o bleeding every day and flow is enough to cover the pad despite Aygestin 10 mg daily. Was given Yasmin to tale TID for 3 days, BID for 3 days followed by daily.  Review of system:   Pertinent items in the HPI  Cardiovascular: negative. No SOB. No cough  GI: lack of appetite with occasional nausea  Urinary: negative  Extremities: denies swelling or pain  Past Medical History:    Past Medical History   Diagnosis  Date   .  Anemia    .  Hypertension    .  Head ache    .  History of chicken pox    .  Pelvic kidney     Allergies:    Allergies   Allergen  Reactions   .  Hydrocodone  Nausea And Vomiting     Patient will not take this   .  Codeine  Rash    Social History:    History    Social History   .  Marital Status:  Married     Spouse Name:  N/A     Number of Children:  N/A   .  Years of Education:  N/A    Social History Main Topics   .  Smoking status:  Never Smoker   .  Smokeless tobacco:  Never Used   .  Alcohol Use:  No   .  Drug Use:  No   .  Sexually Active:  Yes -- Female partner(s)     Birth Control/ Protection:  Surgical      Comment: BTL    Other Topics  Concern   .  None    Social History Narrative   .  None    Family History:    Family History   Problem  Relation  Age of Onset   .  Stroke  Father      .  Hypertension  Father    .  Diabetes  Father    .  Heart disease  Mother    .  Breast cancer  Mother    .  Thyroid disease  Mother    .  Anemia  Mother    .  Diabetes  Mother    .  Diabetes  Brother    .  Asthma  Daughter    .  Diabetes  Paternal Aunt    .  Diabetes  Maternal Aunt    .  Diabetes  Maternal Uncle    .  Diabetes  Paternal Uncle    BP 120/70  Pulse 72  Temp 98.3 F (36.8 C) (Oral)  Resp 16  Ht 5\' 7"  (1.702 m)  Wt 296 lb 12 oz (134.605 kg)  BMI 46.48 kg/m2  SpO2 98% Physical Examination: General appearance - alert, well  appearing, and in no distress Chest - clear to auscultation, no wheezes, rales or rhonchi, symmetric air entry Heart - normal rate and regular rhythm Abdomen - soft, nontender, nondistended, no masses or organomegaly Pelvic - normal external genitalia, vulva, vagina, cervix, uterus and adnexa, moderate vaginal bleeding Extremities - Homan's sign negative bilaterally US normal no fibroids seen Results for orders placed during the hospital encounter of 02/26/12  POCT PREGNANCY, URINE      Component Value Range   Preg Test, Ur NEGATIVE  NEGATIVE  CBC      Component Value Range   WBC 9.6  4.0 - 10.5 K/uL   RBC 3.97  3.87 - 5.11 MIL/uL   Hemoglobin 10.0 (*) 12.0 - 15.0 g/dL   HCT 16.1 (*) 09.6 - 04.5 %   MCV 80.4  78.0 - 100.0 fL   MCH 25.2 (*) 26.0 - 34.0 pg   MCHC 31.3  30.0 - 36.0 g/dL   RDW 40.9 (*) 81.1 - 91.4 %   Platelets 255  150 - 400 K/uL  COMPREHENSIVE METABOLIC PANEL      Component Value Range   Sodium    135 - 145 mEq/L   Value: QUESTIONABLE IDENTIFICATION / INCORRECTLY LABELED SPECIMEN   Potassium    3.5 - 5.1 mEq/L   Value: QUESTIONABLE IDENTIFICATION / INCORRECTLY LABELED SPECIMEN   Chloride    96 - 112 mEq/L   Value: QUESTIONABLE IDENTIFICATION / INCORRECTLY LABELED SPECIMEN   CO2    19 - 32 mEq/L   Value: QUESTIONABLE IDENTIFICATION / INCORRECTLY LABELED SPECIMEN   Glucose, Bld    70 - 99 mg/dL   Value: QUESTIONABLE  IDENTIFICATION / INCORRECTLY LABELED SPECIMEN   BUN    6 - 23 mg/dL   Value: QUESTIONABLE IDENTIFICATION / INCORRECTLY LABELED SPECIMEN   Creatinine, Ser    0.50 - 1.10 mg/dL   Value: QUESTIONABLE IDENTIFICATION / INCORRECTLY LABELED SPECIMEN   Calcium    8.4 - 10.5 mg/dL   Value: QUESTIONABLE IDENTIFICATION / INCORRECTLY LABELED SPECIMEN   Total Protein    6.0 - 8.3 g/dL   Value: QUESTIONABLE IDENTIFICATION / INCORRECTLY LABELED SPECIMEN   Albumin    3.5 - 5.2 g/dL   Value: QUESTIONABLE IDENTIFICATION / INCORRECTLY LABELED SPECIMEN   AST    0 - 37 U/L   Value: QUESTIONABLE IDENTIFICATION / INCORRECTLY LABELED SPECIMEN   ALT    0 - 35 U/L   Value: QUESTIONABLE IDENTIFICATION / INCORRECTLY LABELED SPECIMEN   Alkaline Phosphatase    39 - 117 U/L   Value: QUESTIONABLE IDENTIFICATION / INCORRECTLY LABELED SPECIMEN   Total Bilirubin    0.3 - 1.2 mg/dL   Value: QUESTIONABLE IDENTIFICATION / INCORRECTLY LABELED SPECIMEN   GFR calc non Af Amer    >90 mL/min   Value: QUESTIONABLE IDENTIFICATION / INCORRECTLY LABELED SPECIMEN   GFR calc Af Amer    >90 mL/min   Value: QUESTIONABLE IDENTIFICATION / INCORRECTLY LABELED SPECIMEN  COMPREHENSIVE METABOLIC PANEL      Component Value Range   Sodium 135  135 - 145 mEq/L   Potassium 3.7  3.5 - 5.1 mEq/L   Chloride 101  96 - 112 mEq/L   CO2 27  19 - 32 mEq/L   Glucose, Bld 132 (*) 70 - 99 mg/dL   BUN 9  6 - 23 mg/dL   Creatinine, Ser 7.82  0.50 - 1.10 mg/dL   Calcium 9.0  8.4 - 95.6 mg/dL   Total Protein 6.6  6.0 - 8.3  g/dL   Albumin 3.0 (*) 3.5 - 5.2 g/dL   AST 13  0 - 37 U/L   ALT 18  0 - 35 U/L   Alkaline Phosphatase 84  39 - 117 U/L   Total Bilirubin 0.3  0.3 - 1.2 mg/dL   GFR calc non Af Amer 78 (*) >90 mL/min   GFR calc Af Amer >90  >90 mL/min   DUB Anemia Admit for one dose of IV premarin.  Pt has had a hysteroscopy in the past.  She did not have an endometrial ablation.  She was offered [facility] with ablation today.  Pt understands the  risks are bleeding, infection and perforation of the uterus, which could damage other organs.  Pt understands endometrial ablation is 85 % effective and if it fails she may still need a hysterectomy.

## 2012-02-26 NOTE — Op Note (Signed)
Preop Diagnosis: bleeding  DUB  Postop Diagnosis: bleeding DUB  Procedure: DILATATION & CURETTAGE/HYSTEROSCOPY WITH THERMACHOICE ABLATION   Anesthesia: General   Anesthesiologist: No responsible anesthesiologist has been recorded for the case.   Attending: Michael Litter, MD   Assistant: none  Findings: abundant blood and tissue in the uterine cavity.  The ostia was visualized  Pathology: endometrial currettings sent  Fluids:  UOP: minimal  EBL: minimal  Complications: none.    Procedure: The patient was taken to the operating room after risks benefits and alternatives were discussed with patient, the patient verbalized understanding and consent signed and witnessed. The patient was placed under general anesthesia and prepped and draped in normal sterile fashion. A bivalve speculum was placed in the patient's vagina and the anterior lip of the cervix was grasped with a single-tooth tenaculum. The cervix was dilated for passage of the hysteroscope. The uterus sounded to 9 cm.  The hysteroscope was introduced into the uterine cavity with findings as noted above. A curettage was performed and currettings sent to pathology. The hysteroscope was reintroduced and hydrothermal ablation was performed without difficulty. The hysteroscope was placed back into the uterine cavity.  There were no signs of perforation.bilve speculum were removed and there was good hemostasis at the tenaculum sites. Sponge lap and needle count was correct. The patient tolerated procedure well and was returned to the recovery room in good condition. No deficit was noted

## 2012-02-26 NOTE — Transfer of Care (Signed)
Immediate Anesthesia Transfer of Care Note  Patient: Destiny Delgado  Procedure(s) Performed: Procedure(s) (LRB) with comments: DILATATION & CURETTAGE/HYSTEROSCOPY WITH THERMACHOICE ABLATION (N/A)  Patient Location: PACU  Anesthesia Type:General  Level of Consciousness: awake, alert  and sedated  Airway & Oxygen Therapy: Patient connected to nasal cannula oxygen  Post-op Assessment: Report given to PACU RN and Post -op Vital signs reviewed and stable  Post vital signs: Reviewed and stable  Complications: No apparent anesthesia complications

## 2012-02-27 ENCOUNTER — Encounter (HOSPITAL_COMMUNITY): Payer: Self-pay | Admitting: Obstetrics and Gynecology

## 2012-02-27 ENCOUNTER — Encounter: Payer: Self-pay | Admitting: Obstetrics and Gynecology

## 2012-02-27 NOTE — Telephone Encounter (Signed)
Letter printed and faxed to number. Tried to contact pt to advise of letter being faxed. Mailbox full.  Darien Ramus, CMA

## 2012-02-27 NOTE — Telephone Encounter (Signed)
Letter is ready to print and stamp with my signature

## 2012-02-28 NOTE — Progress Notes (Signed)
Phone call to pt .  She says she is cramping.  I told her to take Motrin for the cramping.  The bleeding has decreased.

## 2012-03-03 ENCOUNTER — Telehealth: Payer: Self-pay | Admitting: Obstetrics and Gynecology

## 2012-03-04 ENCOUNTER — Encounter: Payer: Self-pay | Admitting: Obstetrics and Gynecology

## 2012-03-04 ENCOUNTER — Telehealth: Payer: Self-pay | Admitting: Obstetrics and Gynecology

## 2012-03-04 ENCOUNTER — Ambulatory Visit: Payer: BC Managed Care – PPO | Admitting: Obstetrics and Gynecology

## 2012-03-04 VITALS — BP 120/72 | HR 70 | Temp 98.6°F | Resp 16 | Ht 71.0 in | Wt 296.0 lb

## 2012-03-04 DIAGNOSIS — D649 Anemia, unspecified: Secondary | ICD-10-CM

## 2012-03-04 DIAGNOSIS — N920 Excessive and frequent menstruation with regular cycle: Secondary | ICD-10-CM

## 2012-03-04 DIAGNOSIS — Z01818 Encounter for other preprocedural examination: Secondary | ICD-10-CM

## 2012-03-04 MED ORDER — HYDROMORPHONE HCL 2 MG PO TABS: 2.0000 mg | ORAL_TABLET | ORAL | Status: DC | PRN

## 2012-03-04 NOTE — Progress Notes (Signed)
Destiny Delgado is a 42 y.o. female G4P4 who presents for hysterectomy because of relentless dysfunctional uterine bleeding. Patient's menstrual history was a normal monthly moderate flow until February of 2013.  At that time, the  patient began to have constant uterine bleeding of some kind, with 3 weeks being the longest she has not had some bleeding.  This bleeding was accompanied by clots and required the change of a  pad hourly.  Her cramps rated as a 10/10 on a 10 point pain scale were initially responsive to Ibuprofen 800 mg and would decrease intensity to a 5/10.  For the past few months, however, the  patient has only found cramp relief with Dilaudid.  She admits to urinary frequency but denies incontinence, constipation, diarrhea, fever or vaginitis symptoms.  In March of 2013 the patient had hysteroscopy with polypectomy  and blood transfusion but there was no improvement in her bleeding.  She was placed on birth control pills which were also ineffective. A pelvic ultrasound in December 2013 showed a uterus 9.24 x 5.60 x 5.39 cm with left ovary 2.02 x 1.33 x 1.29 cm right ovary 5.69 x 3.30 x 2.39 cm with an ovarian cyst 2.72 x 2.9 x 2.35 cm with a normal thyroid panel and prolactin.  At this point the patient was placed on Aygestin 5-10 mg daily to curtail her bleeding,that had once again become heavy and had lasted for 3 consecutive weeks.  Patient continued to bleed but at a manageable rate until January her bleeding increased such that she required transfusion of 3 units of PRBCs.  She was subsequently prescribed a 35 mcg birth control pill that she failed to start after hospital discharge, and again began to bleed to the point that she underwent a hysteroscopy, with D & C and Thermachoice ablation on February 27, 2012.  Though her bleeding continued daily, it was mostly spotting.  A review of all medical and surgical management options had been given to the patient, with her having a trial of most,   and at this juncture she has decided to proceed with definitive therapy in the form of hysterectomy with the understanding, that due to her pelvic kidney, she will need evaluation by a Urologist to plan for placement of ureteral stents intraoperatively.   Past Medical History  OB History: G4P4 SVD, 1991, 1993, 1994,  and 1995 with largest infant weighing 7lbs. and 7 oz.  GYN History: menarche: 42 YO;    LMP: Bleed continuously;  Contracepton bilateral tubal ligation  The patient denies history of sexually transmitted disease.  Remote  history of abnormal PAP smear as a teenager-normal since;  Last PAP smear: 2013  Medical History: Pelvic kidney, hypertension, anemia and migraine  Surgical History:  1994 Tubal Sterilization; 2005 Cholecystectomy; 2013 Hysteroscopy for Menorrhagia; 2014 Hysteroscopy with D & C and Endometrial Ablation (Thermachoice)  Transfused 2 units of packed red blood cells in 2013 and 4 units of packed red blood cells in 2014-all due to menorrhagia  Family History: Stroke, hypertension, cardiovascular disease, anemia, aneurysm, acid reflux, end stage renal disease and renal cancer  Social History:  Married and 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 HYDROmorphone (DILAUDID) 2 MG tablet Take 1 tablet (2 mg total) by mouth every 4 (four) hours as needed for pain.  30 tablet  0  . ibuprofen (ADVIL,MOTRIN)  800 MG tablet Take 800 mg by mouth every 6 (six) hours as needed. For pain      . lisinopril (PRINIVIL,ZESTRIL) 10 MG tablet Take 10 mg by mouth daily.      . Multiple Vitamins-Minerals (ALIVE ONCE DAILY WOMENS 50+) TABS Take 1 tablet by mouth daily.      . promethazine (PHENERGAN) 25 MG tablet Take 1 tablet (25 mg total) by mouth every 6 (six) hours as needed for nausea.  15 tablet  0    Allergies  Allergen Reactions  .  Hydrocodone Nausea And Vomiting    Patient will not take this  . Ultram (Tramadol) Nausea And Vomiting  . Codeine Rash  Sensitive to shellfish-causes swelling and itching and soy products cause nausea and vomiting  Denies sensitivity to peanuts,  latex or adhesives.  ROS: Admits to headaches twice a month-relieved with Tylenol, nasal congestion with weather changes,  vision changes, and occasional bilateral ankle swelling;  Denies dysphagia, tinnitus, dizziness, hoarseness, cough,  chest pain, shortness of breath, nausea, vomiting, diarrhea,constipation,  urinary frequency, urgency  dysuria, hematuria, vaginitis symptoms, pelvic pain, swelling of joints,easy bruising,  myalgias, arthralgias, skin rashes, unexplained weight loss and except as is mentioned in the history of present illness, patient's review of systems is otherwise negative.  Physical Exam    BP 120/72  Pulse 70  Temp 98.6 F (37 C) (Oral)  Resp 16  Ht 5\' 11"  (1.803 m)  Wt 296 lb (134.265 kg)  BMI 41.28 kg/m2  Neck: supple without masses or thyromegaly Lungs: clear to auscultation Heart: regular rate and rhythm Abdomen: soft, non-tender and no organomegaly Pelvic:EGBUS- wnl; vagina-normal rugae, small blood,  uterus-normal size, cervix without lesions or motion tenderness; (exam limited by habitus),  adnexae-no tenderness or masses Extremities:  no clubbing, cyanosis or edema   Assesment:  Menorrhagia                       Severe Anemia                       S/P Endometrial Ablation   Disposition:  The patient was given the indication for her procedure(s) along with the risks, which include but are not limited to, reaction to anesthesia, damage to adjacent organs, infection, excessive bleeding, formation of scar tissue, early menopause, pelvic prolapse and the possible need for an open abdominal incision. She was further advised that she will experience transient post operative facial edema, that her hospital stay is  expected to be 0-2 days, she should be able to return to her usual activities within 2-3 weeks (except intercourse to be delayed until 6 weeks) and that the robotic approach to her surgery requires more time to perform than an open abdominal approach.  Patient was given the Miralax bowel prep to be completed 24 hours prior to procedure.  She verbalized understanding of these risks and pre-operative instructions and has consented to proceed with a Robot Assisted Total Laparoscopic Hysterectomy with a possible Total Abdominal Hysterectomy at [facility] on March 27, 2012 at 8:30 a.m.  CSN# [id number]   Shakeda Pearse J. Lowell Guitar, PA-C  for Dr.  Crist Fat. Rivard.

## 2012-03-05 NOTE — Telephone Encounter (Signed)
Pt may be out of work for one week after her surgery because of her anemia.  Dr Estanislado Pandy will decide if she needs more time off

## 2012-03-10 ENCOUNTER — Encounter (HOSPITAL_COMMUNITY): Payer: Self-pay

## 2012-03-11 ENCOUNTER — Encounter (HOSPITAL_COMMUNITY): Payer: Self-pay | Admitting: *Deleted

## 2012-03-13 ENCOUNTER — Other Ambulatory Visit (HOSPITAL_COMMUNITY): Payer: BC Managed Care – PPO

## 2012-03-17 ENCOUNTER — Other Ambulatory Visit: Payer: Self-pay | Admitting: Urology

## 2012-03-18 ENCOUNTER — Encounter: Payer: BC Managed Care – PPO | Admitting: Obstetrics and Gynecology

## 2012-03-19 ENCOUNTER — Other Ambulatory Visit: Payer: Self-pay | Admitting: Obstetrics and Gynecology

## 2012-03-19 ENCOUNTER — Encounter (HOSPITAL_COMMUNITY)
Admission: RE | Disposition: A | Discharge status: Home / Self Care | Payer: Self-pay | Source: Ambulatory Visit | Attending: Obstetrics and Gynecology

## 2012-03-19 ENCOUNTER — Ambulatory Visit (HOSPITAL_COMMUNITY)
Admission: RE | Admit: 2012-03-19 | Discharge: 2012-03-20 | Disposition: A | Discharge status: Home / Self Care | Payer: BC Managed Care – PPO | Source: Ambulatory Visit | Attending: Obstetrics and Gynecology | Admitting: Obstetrics and Gynecology

## 2012-03-19 ENCOUNTER — Encounter (HOSPITAL_COMMUNITY): Payer: Self-pay | Admitting: Anesthesiology

## 2012-03-19 ENCOUNTER — Ambulatory Visit (HOSPITAL_COMMUNITY): Payer: BC Managed Care – PPO | Admitting: Anesthesiology

## 2012-03-19 ENCOUNTER — Encounter (HOSPITAL_COMMUNITY): Payer: Self-pay | Admitting: *Deleted

## 2012-03-19 ENCOUNTER — Ambulatory Visit (HOSPITAL_COMMUNITY): Payer: BC Managed Care – PPO

## 2012-03-19 DIAGNOSIS — Z90710 Acquired absence of both cervix and uterus: Secondary | ICD-10-CM

## 2012-03-19 DIAGNOSIS — Q638 Other specified congenital malformations of kidney: Secondary | ICD-10-CM | POA: Insufficient documentation

## 2012-03-19 DIAGNOSIS — N938 Other specified abnormal uterine and vaginal bleeding: Secondary | ICD-10-CM | POA: Insufficient documentation

## 2012-03-19 DIAGNOSIS — I10 Essential (primary) hypertension: Secondary | ICD-10-CM | POA: Insufficient documentation

## 2012-03-19 DIAGNOSIS — Q649 Congenital malformation of urinary system, unspecified: Secondary | ICD-10-CM | POA: Insufficient documentation

## 2012-03-19 DIAGNOSIS — D251 Intramural leiomyoma of uterus: Secondary | ICD-10-CM | POA: Insufficient documentation

## 2012-03-19 DIAGNOSIS — D649 Anemia, unspecified: Secondary | ICD-10-CM | POA: Insufficient documentation

## 2012-03-19 HISTORY — DX: Adverse effect of unspecified anesthetic, initial encounter: T41.45XA

## 2012-03-19 HISTORY — PX: CYSTOSCOPY W/ RETROGRADES: SHX1426

## 2012-03-19 HISTORY — DX: Personal history of other medical treatment: Z92.89

## 2012-03-19 HISTORY — PX: BILATERAL SALPINGECTOMY: SHX5743

## 2012-03-19 HISTORY — PX: ROBOTIC ASSISTED TOTAL HYSTERECTOMY: SHX6085

## 2012-03-19 HISTORY — DX: Other complications of anesthesia, initial encounter: T88.59XA

## 2012-03-19 LAB — CBC
MCH: 25.1 pg — ABNORMAL LOW (ref 26.0–34.0)
MCV: 80 fL (ref 78.0–100.0)
Platelets: 283 10*3/uL (ref 150–400)
RBC: 4.31 MIL/uL (ref 3.87–5.11)
RDW: 17.3 % — ABNORMAL HIGH (ref 11.5–15.5)

## 2012-03-19 SURGERY — ROBOTIC ASSISTED TOTAL HYSTERECTOMY
Anesthesia: General | Site: Urethra | Wound class: Clean Contaminated

## 2012-03-19 MED ORDER — PROPOFOL 10 MG/ML IV EMUL
INTRAVENOUS | Status: DC | PRN
  Administered 2012-03-19: 180 mg via INTRAVENOUS

## 2012-03-19 MED ORDER — DEXAMETHASONE SODIUM PHOSPHATE 10 MG/ML IJ SOLN
INTRAMUSCULAR | Status: DC | PRN
  Administered 2012-03-19: 10 mg via INTRAVENOUS

## 2012-03-19 MED ORDER — ONDANSETRON HCL 4 MG/2ML IJ SOLN
INTRAMUSCULAR | Status: AC
  Filled 2012-03-19: qty 2

## 2012-03-19 MED ORDER — HYDROCODONE-ACETAMINOPHEN 5-300 MG PO TABS: 1.0000 | ORAL_TABLET | ORAL | Status: DC | PRN

## 2012-03-19 MED ORDER — ARTIFICIAL TEARS OP OINT
TOPICAL_OINTMENT | OPHTHALMIC | Status: AC
  Filled 2012-03-19: qty 3.5

## 2012-03-19 MED ORDER — FENTANYL CITRATE 0.05 MG/ML IJ SOLN
INTRAMUSCULAR | Status: DC | PRN
  Administered 2012-03-19: 100 ug via INTRAVENOUS
  Administered 2012-03-19 (×7): 25 ug via INTRAVENOUS
  Administered 2012-03-19: 50 ug via INTRAVENOUS
  Administered 2012-03-19: 25 ug via INTRAVENOUS

## 2012-03-19 MED ORDER — LACTATED RINGERS IV SOLN
INTRAVENOUS | Status: DC
  Administered 2012-03-19 (×3): via INTRAVENOUS

## 2012-03-19 MED ORDER — HYDROCODONE-ACETAMINOPHEN 5-325 MG PO TABS: 1.0000 | ORAL_TABLET | ORAL | Status: DC | PRN

## 2012-03-19 MED ORDER — DIPHENHYDRAMINE HCL 25 MG PO CAPS: 25.0000 mg | ORAL_CAPSULE | Freq: Four times a day (QID) | ORAL | Status: DC | PRN

## 2012-03-19 MED ORDER — IBUPROFEN 600 MG PO TABS: 600.0000 mg | ORAL_TABLET | Freq: Four times a day (QID) | ORAL | Status: DC | PRN

## 2012-03-19 MED ORDER — MIDAZOLAM HCL 5 MG/5ML IJ SOLN
INTRAMUSCULAR | Status: DC | PRN
  Administered 2012-03-19: 2 mg via INTRAVENOUS

## 2012-03-19 MED ORDER — ROCURONIUM BROMIDE 100 MG/10ML IV SOLN
INTRAVENOUS | Status: DC | PRN
  Administered 2012-03-19: 10 mg via INTRAVENOUS
  Administered 2012-03-19 (×2): 20 mg via INTRAVENOUS
  Administered 2012-03-19 (×2): 10 mg via INTRAVENOUS
  Administered 2012-03-19: 40 mg via INTRAVENOUS

## 2012-03-19 MED ORDER — IOHEXOL 300 MG/ML  SOLN
INTRAMUSCULAR | Status: DC | PRN
  Administered 2012-03-19: 70 mL

## 2012-03-19 MED ORDER — LACTATED RINGERS IV SOLN
INTRAVENOUS | Status: DC
  Administered 2012-03-19: 21:00:00 via INTRAVENOUS

## 2012-03-19 MED ORDER — MENTHOL 3 MG MT LOZG: 1.0000 | LOZENGE | OROMUCOSAL | Status: DC | PRN

## 2012-03-19 MED ORDER — FENTANYL CITRATE 0.05 MG/ML IJ SOLN
INTRAMUSCULAR | Status: AC
  Filled 2012-03-19: qty 5

## 2012-03-19 MED ORDER — NEOSTIGMINE METHYLSULFATE 1 MG/ML IJ SOLN
INTRAMUSCULAR | Status: DC | PRN
  Administered 2012-03-19 (×2): 2 mg via INTRAVENOUS

## 2012-03-19 MED ORDER — ONDANSETRON HCL 4 MG/2ML IJ SOLN
INTRAMUSCULAR | Status: DC | PRN
  Administered 2012-03-19: 4 mg via INTRAVENOUS

## 2012-03-19 MED ORDER — MIDAZOLAM HCL 2 MG/2ML IJ SOLN
INTRAMUSCULAR | Status: AC
  Filled 2012-03-19: qty 2

## 2012-03-19 MED ORDER — SCOPOLAMINE 1 MG/3DAYS TD PT72
1.0000 | MEDICATED_PATCH | TRANSDERMAL | Status: DC
  Administered 2012-03-19: 1.5 mg via TRANSDERMAL

## 2012-03-19 MED ORDER — LISINOPRIL 10 MG PO TABS
10.0000 mg | ORAL_TABLET | Freq: Every day | ORAL | Status: DC
  Administered 2012-03-20: 10 mg via ORAL
  Filled 2012-03-19 (×2): qty 1

## 2012-03-19 MED ORDER — ROCURONIUM BROMIDE 50 MG/5ML IV SOLN
INTRAVENOUS | Status: AC
  Filled 2012-03-19: qty 2

## 2012-03-19 MED ORDER — KETOROLAC TROMETHAMINE 30 MG/ML IJ SOLN: 15.0000 mg | Freq: Once | INTRAMUSCULAR | Status: DC | PRN

## 2012-03-19 MED ORDER — HYDROMORPHONE HCL PF 1 MG/ML IJ SOLN: 2.0000 mg | Freq: Once | INTRAMUSCULAR | Status: AC

## 2012-03-19 MED ORDER — LIDOCAINE HCL (CARDIAC) 20 MG/ML IV SOLN
INTRAVENOUS | Status: DC | PRN
  Administered 2012-03-19: 50 mg via INTRAVENOUS

## 2012-03-19 MED ORDER — LACTATED RINGERS IR SOLN
Status: DC | PRN
  Administered 2012-03-19: 3000 mL

## 2012-03-19 MED ORDER — FENTANYL CITRATE 0.05 MG/ML IJ SOLN
INTRAMUSCULAR | Status: AC
  Filled 2012-03-19: qty 2

## 2012-03-19 MED ORDER — IOHEXOL 300 MG/ML  SOLN
30.0000 mL | Freq: Once | INTRAMUSCULAR | Status: AC | PRN
  Administered 2012-03-19: 30 mL via URETHRAL

## 2012-03-19 MED ORDER — BUPIVACAINE HCL (PF) 0.25 % IJ SOLN
INTRAMUSCULAR | Status: DC | PRN
  Administered 2012-03-19: 17 mL

## 2012-03-19 MED ORDER — DEXTROSE 5 % IV SOLN
2.0000 g | INTRAVENOUS | Status: AC
  Administered 2012-03-19: 2 g via INTRAVENOUS
  Filled 2012-03-19: qty 2

## 2012-03-19 MED ORDER — DEXAMETHASONE SODIUM PHOSPHATE 10 MG/ML IJ SOLN
INTRAMUSCULAR | Status: AC
  Filled 2012-03-19: qty 1

## 2012-03-19 MED ORDER — IBUPROFEN 600 MG PO TABS
600.0000 mg | ORAL_TABLET | Freq: Four times a day (QID) | ORAL | Status: DC | PRN
  Administered 2012-03-20 (×2): 600 mg via ORAL
  Filled 2012-03-19 (×2): qty 1

## 2012-03-19 MED ORDER — ACETAMINOPHEN 10 MG/ML IV SOLN
INTRAVENOUS | Status: AC
  Administered 2012-03-19: 1000 mg via INTRAVENOUS
  Filled 2012-03-19: qty 100

## 2012-03-19 MED ORDER — HYDROMORPHONE HCL PF 1 MG/ML IJ SOLN
INTRAMUSCULAR | Status: AC
  Filled 2012-03-19: qty 1

## 2012-03-19 MED ORDER — GLYCOPYRROLATE 0.2 MG/ML IJ SOLN
INTRAMUSCULAR | Status: DC | PRN
  Administered 2012-03-19 (×2): 0.4 mg via INTRAVENOUS

## 2012-03-19 MED ORDER — ACETAMINOPHEN 10 MG/ML IV SOLN
1000.0000 mg | Freq: Once | INTRAVENOUS | Status: DC
  Filled 2012-03-19: qty 100

## 2012-03-19 MED ORDER — FENTANYL CITRATE 0.05 MG/ML IJ SOLN
INTRAMUSCULAR | Status: AC
Start: 2012-03-19 — End: 2012-03-19
  Administered 2012-03-19: 50 ug via INTRAVENOUS
  Filled 2012-03-19: qty 2

## 2012-03-19 MED ORDER — KETOROLAC TROMETHAMINE 30 MG/ML IJ SOLN
INTRAMUSCULAR | Status: AC
  Administered 2012-03-19: 30 mg via INTRAVENOUS
  Filled 2012-03-19: qty 1

## 2012-03-19 MED ORDER — HYDROMORPHONE HCL 2 MG PO TABS
2.0000 mg | ORAL_TABLET | ORAL | Status: DC | PRN
  Administered 2012-03-19 – 2012-03-20 (×4): 2 mg via ORAL
  Filled 2012-03-19 (×4): qty 1

## 2012-03-19 MED ORDER — ACETAMINOPHEN 10 MG/ML IV SOLN
1000.0000 mg | Freq: Once | INTRAVENOUS | Status: AC | PRN
  Administered 2012-03-19: 1000 mg via INTRAVENOUS
  Filled 2012-03-19: qty 100

## 2012-03-19 MED ORDER — LIDOCAINE HCL (CARDIAC) 20 MG/ML IV SOLN
INTRAVENOUS | Status: AC
  Filled 2012-03-19: qty 5

## 2012-03-19 MED ORDER — FENTANYL CITRATE 0.05 MG/ML IJ SOLN
25.0000 ug | INTRAMUSCULAR | Status: DC | PRN
  Administered 2012-03-19 (×3): 50 ug via INTRAVENOUS

## 2012-03-19 MED ORDER — PROPOFOL 10 MG/ML IV EMUL
INTRAVENOUS | Status: AC
  Filled 2012-03-19: qty 20

## 2012-03-19 MED ORDER — STERILE WATER FOR IRRIGATION IR SOLN
Status: DC | PRN
  Administered 2012-03-19: 3000 mL via INTRAVESICAL

## 2012-03-19 MED ORDER — HYDROMORPHONE HCL PF 1 MG/ML IJ SOLN
INTRAMUSCULAR | Status: AC
  Administered 2012-03-19: 2 mg via SUBCUTANEOUS
  Filled 2012-03-19: qty 2

## 2012-03-19 MED ORDER — ONDANSETRON HCL 4 MG PO TABS: 4.0000 mg | ORAL_TABLET | Freq: Three times a day (TID) | ORAL | Status: DC | PRN

## 2012-03-19 MED ORDER — SCOPOLAMINE 1 MG/3DAYS TD PT72
MEDICATED_PATCH | TRANSDERMAL | Status: AC
  Administered 2012-03-19: 1.5 mg via TRANSDERMAL
  Filled 2012-03-19: qty 1

## 2012-03-19 MED ORDER — ONDANSETRON HCL 4 MG/2ML IJ SOLN
4.0000 mg | INTRAMUSCULAR | Status: DC | PRN
  Administered 2012-03-19: 4 mg via INTRAVENOUS
  Filled 2012-03-19: qty 2

## 2012-03-19 MED ORDER — ACETAMINOPHEN 10 MG/ML IV SOLN
1000.0000 mg | Freq: Once | INTRAVENOUS | Status: AC
  Administered 2012-03-19: 1000 mg via INTRAVENOUS

## 2012-03-19 MED ORDER — HYDROMORPHONE HCL PF 1 MG/ML IJ SOLN
INTRAMUSCULAR | Status: AC
  Administered 2012-03-19: 2 mg via SUBCUTANEOUS
  Filled 2012-03-19: qty 1

## 2012-03-19 MED ORDER — KETOROLAC TROMETHAMINE 30 MG/ML IJ SOLN
30.0000 mg | Freq: Four times a day (QID) | INTRAMUSCULAR | Status: DC
  Administered 2012-03-19: 30 mg via INTRAVENOUS
  Filled 2012-03-19: qty 1

## 2012-03-19 MED ORDER — ONDANSETRON HCL 4 MG PO TABS: 4.0000 mg | ORAL_TABLET | Freq: Three times a day (TID) | ORAL | Status: DC

## 2012-03-19 MED ORDER — MUPIROCIN 2 % EX OINT
TOPICAL_OINTMENT | CUTANEOUS | Status: AC
  Filled 2012-03-19: qty 22

## 2012-03-19 MED ORDER — KETOROLAC TROMETHAMINE 30 MG/ML IJ SOLN: 30.0000 mg | Freq: Once | INTRAMUSCULAR | Status: AC

## 2012-03-19 MED ORDER — ZOLPIDEM TARTRATE 5 MG PO TABS: 5.0000 mg | ORAL_TABLET | Freq: Every evening | ORAL | Status: DC | PRN

## 2012-03-19 MED ORDER — BUPIVACAINE HCL (PF) 0.25 % IJ SOLN
INTRAMUSCULAR | Status: AC
  Filled 2012-03-19: qty 30

## 2012-03-19 MED ORDER — FENTANYL CITRATE 0.05 MG/ML IJ SOLN
INTRAMUSCULAR | Status: AC
  Administered 2012-03-19: 50 ug via INTRAVENOUS
  Filled 2012-03-19: qty 2

## 2012-03-19 SURGICAL SUPPLY — 70 items
BAG URINE DRAINAGE (UROLOGICAL SUPPLIES) ×8 IMPLANT
BARRIER ADHS 3X4 INTERCEED (GAUZE/BANDAGES/DRESSINGS) ×4 IMPLANT
BENZOIN TINCTURE PRP APPL 2/3 (GAUZE/BANDAGES/DRESSINGS) ×4 IMPLANT
CATH FOLEY 3WAY  5CC 16FR (CATHETERS) ×2
CATH FOLEY 3WAY  5CC 18FR (CATHETERS) ×1
CATH FOLEY 3WAY 5CC 16FR (CATHETERS) ×6 IMPLANT
CATH FOLEY 3WAY 5CC 18FR (CATHETERS) ×3 IMPLANT
CHLORAPREP W/TINT 26ML (MISCELLANEOUS) ×4 IMPLANT
CLOTH BEACON ORANGE TIMEOUT ST (SAFETY) ×4 IMPLANT
CONT PATH 16OZ SNAP LID 3702 (MISCELLANEOUS) ×4 IMPLANT
CORDS BIPOLAR (ELECTRODE) IMPLANT
COVER LIGHT HANDLE  1/PK (MISCELLANEOUS) ×2
COVER LIGHT HANDLE 1/PK (MISCELLANEOUS) ×6 IMPLANT
COVER MAYO STAND STRL (DRAPES) ×4 IMPLANT
COVER TABLE BACK 60X90 (DRAPES) ×8 IMPLANT
COVER TIP SHEARS 8 DVNC (MISCELLANEOUS) ×3 IMPLANT
COVER TIP SHEARS 8MM DA VINCI (MISCELLANEOUS) ×1
DECANTER SPIKE VIAL GLASS SM (MISCELLANEOUS) ×4 IMPLANT
DERMABOND ADVANCED (GAUZE/BANDAGES/DRESSINGS) ×1
DERMABOND ADVANCED .7 DNX12 (GAUZE/BANDAGES/DRESSINGS) ×3 IMPLANT
DRAPE HUG U DISPOSABLE (DRAPE) ×4 IMPLANT
DRAPE LG THREE QUARTER DISP (DRAPES) ×8 IMPLANT
DRAPE WARM FLUID 44X44 (DRAPE) ×4 IMPLANT
ELECT REM PT RETURN 9FT ADLT (ELECTROSURGICAL) ×4
ELECTRODE REM PT RTRN 9FT ADLT (ELECTROSURGICAL) ×3 IMPLANT
EVACUATOR SMOKE 8.L (FILTER) ×4 IMPLANT
GAUZE VASELINE 3X9 (GAUZE/BANDAGES/DRESSINGS) ×8 IMPLANT
GLOVE BIOGEL PI IND STRL 7.0 (GLOVE) ×9 IMPLANT
GLOVE BIOGEL PI INDICATOR 7.0 (GLOVE) ×3
GLOVE ECLIPSE 6.5 STRL STRAW (GLOVE) ×12 IMPLANT
GOWN STRL REIN XL XLG (GOWN DISPOSABLE) ×40 IMPLANT
GRASPER BIPOLAR FEN DA VINCI (INSTRUMENTS)
GRASPER BPLR FEN DVNC (INSTRUMENTS) IMPLANT
GUIDEWIRE STR DUAL SENSOR (WIRE) ×4 IMPLANT
KIT ACCESSORY DA VINCI DISP (KITS) ×1
KIT ACCESSORY DVNC DISP (KITS) ×3 IMPLANT
LEGGING LITHOTOMY PAIR STRL (DRAPES) ×4 IMPLANT
NEEDLE HYPO 22GX1.5 SAFETY (NEEDLE) ×4 IMPLANT
OCCLUDER COLPOPNEUMO (BALLOONS) IMPLANT
PACK LAVH (CUSTOM PROCEDURE TRAY) ×4 IMPLANT
PAD PREP 24X48 CUFFED NSTRL (MISCELLANEOUS) ×8 IMPLANT
PLUG CATH AND CAP STER (CATHETERS) ×4 IMPLANT
PROTECTOR NERVE ULNAR (MISCELLANEOUS) ×8 IMPLANT
SET CYSTO W/LG BORE CLAMP LF (SET/KITS/TRAYS/PACK) IMPLANT
SET IRRIG TUBING LAPAROSCOPIC (IRRIGATION / IRRIGATOR) ×4 IMPLANT
SOLUTION ELECTROLUBE (MISCELLANEOUS) ×4 IMPLANT
STRIP CLOSURE SKIN 1/2X4 (GAUZE/BANDAGES/DRESSINGS) ×4 IMPLANT
SUT MNCRL AB 3-0 PS2 27 (SUTURE) IMPLANT
SUT VIC AB 0 CT1 27 (SUTURE) ×6
SUT VIC AB 0 CT1 27XBRD ANBCTR (SUTURE) ×6 IMPLANT
SUT VIC AB 0 CT1 27XBRD ANTBC (SUTURE) ×12 IMPLANT
SUT VICRYL 0 UR6 27IN ABS (SUTURE) ×8 IMPLANT
SYR 50ML LL SCALE MARK (SYRINGE) ×4 IMPLANT
SYSTEM CONVERTIBLE TROCAR (TROCAR) ×4 IMPLANT
TIP RUMI ORANGE 6.7MMX12CM (TIP) IMPLANT
TIP UTERINE 5.1X6CM LAV DISP (MISCELLANEOUS) IMPLANT
TIP UTERINE 6.7X10CM GRN DISP (MISCELLANEOUS) IMPLANT
TIP UTERINE 6.7X6CM WHT DISP (MISCELLANEOUS) IMPLANT
TIP UTERINE 6.7X8CM BLUE DISP (MISCELLANEOUS) ×4 IMPLANT
TOWEL OR 17X24 6PK STRL BLUE (TOWEL DISPOSABLE) ×12 IMPLANT
TROCAR 12M 150ML BLUNT (TROCAR) ×4 IMPLANT
TROCAR 5M 150ML BLDLS (TROCAR) ×4 IMPLANT
TROCAR DISP BLADELESS 8 DVNC (TROCAR) ×3 IMPLANT
TROCAR DISP BLADELESS 8MM (TROCAR) ×1
TROCAR XCEL 12X100 BLDLESS (ENDOMECHANICALS) ×4 IMPLANT
TUBING FILTER THERMOFLATOR (ELECTROSURGICAL) ×4 IMPLANT
TUBING NON-CON 1/4 X 20 CONN (TUBING) ×4 IMPLANT
WARMER LAPAROSCOPE (MISCELLANEOUS) ×4 IMPLANT
WATER STERILE IRR 1000ML POUR (IV SOLUTION) ×12 IMPLANT
YANKAUER SUCT BULB TIP NO VENT (SUCTIONS) ×4 IMPLANT

## 2012-03-19 NOTE — Anesthesia Preprocedure Evaluation (Addendum)
Anesthesia Evaluation  Patient identified by MRN, date of birth, ID band Patient awake    Reviewed: Allergy & Precautions, H&P , NPO status , Patient's Chart, lab work & pertinent test results, reviewed documented beta blocker date and time   History of Anesthesia Complications (+) PONV and PROLONGED EMERGENCE  Airway Mallampati: III TM Distance: >3 FB Neck ROM: full  Mouth opening: Limited Mouth Opening  Dental  (+)    Pulmonary neg pulmonary ROS,  breath sounds clear to auscultation  Pulmonary exam normal       Cardiovascular Exercise Tolerance: Good hypertension, On Medications Rhythm:regular Rate:Normal     Neuro/Psych negative neurological ROS  negative psych ROS   GI/Hepatic Neg liver ROS, Bowel prep,  Endo/Other  Morbid obesity  Renal/GU Pelvic kidney  Female GU complaint (DUB)     Musculoskeletal   Abdominal Normal abdominal exam  (+)   Peds  Hematology  (+) Blood dyscrasia (h/o blood transfusion last month), anemia ,   Anesthesia Other Findings   Reproductive/Obstetrics negative OB ROS                          Anesthesia Physical Anesthesia Plan  ASA: III  Anesthesia Plan: General ETT   Post-op Pain Management:    Induction:   Airway Management Planned:   Additional Equipment:   Intra-op Plan:   Post-operative Plan:   Informed Consent: I have reviewed the patients History and Physical, chart, labs and discussed the procedure including the risks, benefits and alternatives for the proposed anesthesia with the patient or authorized representative who has indicated his/her understanding and acceptance.   Dental Advisory Given  Plan Discussed with: CRNA and Surgeon  Anesthesia Plan Comments:        Anesthesia Quick Evaluation

## 2012-03-19 NOTE — Discharge Summary (Signed)
  Physician Discharge Summary  Patient ID: Destiny Delgado MRN: [id number] DOB/AGE: 04/19/1970 42 y.o.  Admit date: 03/19/2012 Discharge date: 03/19/2012   Discharge Diagnoses: Dysfunctional Uterine Bleeding and Pelvic Kidney Active Problems:   * No active hospital problems. *   Operation: Robot Assisted Total Laparoscopic Hysterectomy with Bilateral Salpingectomy,  Placement and Removal of Lighted Ureteral Stents and Cystoscopy   Discharged Condition:  Stable  Hospital Course: On the date of admission the patient underwent the aforementioned procedures, tolerating them well.  Post operative course was unremarkable with the patient resuming bowel and bladder function and adequate analgesia on post operative day #1  and was therefore deemed ready for discharge home.  Disposition: 01-Home or Self Care  Discharge Medications:    Medication List    STOP taking these medications       ciprofloxacin 500 MG tablet  Commonly known as:  CIPRO     HYDROmorphone 2 MG tablet  Commonly known as:  DILAUDID      TAKE these medications       ALIVE ONCE DAILY WOMENS 50+ Tabs  Take 1 tablet by mouth daily.     Hydrocodone-Acetaminophen 5-300 MG Tabs  Take 1-2 tablets by mouth every 4 (four) hours as needed.     ibuprofen 600 MG tablet  Commonly known as:  ADVIL,MOTRIN  Take 1 tablet (600 mg total) by mouth every 6 (six) hours as needed for pain (must take with food as directed x 5 days then prn).     lisinopril 10 MG tablet  Commonly known as:  PRINIVIL,ZESTRIL  Take 10 mg by mouth daily.     ondansetron 4 MG tablet  Commonly known as:  ZOFRAN  Take 1 tablet (4 mg total) by mouth every 8 (eight) hours as needed for nausea.         Follow-up: Dr.Rivard, April 01, 2012 at 10:40 a.m.  SignedHenreitta Leber, PA-C 03/19/2012, 1:58 PM

## 2012-03-19 NOTE — Brief Op Note (Signed)
03/19/2012  10:54 PM  PATIENT:  Destiny Delgado  43 y.o. female  PRE-OPERATIVE DIAGNOSIS:  ; CPT [id number]; S2900Dysfunctional Uterine Bleeding  POST-OPERATIVE DIAGNOSIS:  Dysfunctional Uterine Bleeding, left pelvic kidney  PROCEDURE:  Procedure(s) with comments: ROBOTIC ASSISTED TOTAL HYSTERECTOMY (N/A) CYSTOSCOPY WITH RETROGRADE PYELOGRAM (Bilateral) - with lighted bil stents BILATERAL SALPINGECTOMY (Bilateral) - robotic assisted   SURGEON:  Surgeon(s) and Role: Panel 1:    * Esmeralda Arthur, MD - Primary  Panel 2:    * Kathi Ludwig, MD - Primary  PHYSICIAN ASSISTANT:   ASSISTANTS: Henreitta Leber PA-C   ANESTHESIA:   general Dr Rodman Pickle  EBL:  Total I/O In: -  Out: 450 [Urine:400; Emesis/NG output:50]  BLOOD ADMINISTERED:none  DRAINS: none   LOCAL MEDICATIONS USED:  MARCAINE     SPECIMEN:  Source of Specimen:  uterus and tubes  DISPOSITION OF SPECIMEN:  PATHOLOGY  COUNTS:  YES  DICTATION: .Other Dictation: Dictation Number [id number]  PLAN OF CARE: Admit for overnight observation  PATIENT DISPOSITION:  PACU - hemodynamically stable.

## 2012-03-19 NOTE — Anesthesia Postprocedure Evaluation (Signed)
  Anesthesia Post Note  Patient: Destiny Delgado  Procedure(s) Performed: Procedure(s) (LRB): ROBOTIC ASSISTED TOTAL HYSTERECTOMY (N/A) CYSTOSCOPY WITH RETROGRADE PYELOGRAM (Bilateral) BILATERAL SALPINGECTOMY (Bilateral)  Anesthesia type: GA  Patient location: PACU  Post pain: Pain level controlled  Post assessment: Post-op Vital signs reviewed  Last Vitals:  Filed Vitals:   03/19/12 1400  BP:   Pulse:   Temp: 36.5 C  Resp:     Post vital signs: Reviewed  Level of consciousness: sedated  Complications: No apparent anesthesia complications

## 2012-03-19 NOTE — Transfer of Care (Signed)
Immediate Anesthesia Transfer of Care Note  Patient: Destiny Delgado  Procedure(s) Performed: Procedure(s) with comments: ROBOTIC ASSISTED TOTAL HYSTERECTOMY (N/A) CYSTOSCOPY WITH RETROGRADE PYELOGRAM (Bilateral) - with lighted bil stents BILATERAL SALPINGECTOMY (Bilateral) - robotic assisted   Patient Location: PACU  Anesthesia Type:General  Level of Consciousness: sedated  Airway & Oxygen Therapy: Patient Spontanous Breathing and Patient connected to nasal cannula oxygen  Post-op Assessment: Report given to PACU RN  Post vital signs: Reviewed and stable  Complications: No apparent anesthesia complications

## 2012-03-19 NOTE — Interval H&P Note (Signed)
History and Physical Interval Note:  03/19/2012 7:54 AM  Kearney Hard  has presented today for surgery, with the diagnosis of Dysfunctional Uterine Bleeding; CPT [phone]; S2900  The various methods of treatment have been discussed with the patient and family. After consideration of risks, benefits and other options for treatment, the patient has consented to  Procedure(s) with comments: ROBOTIC ASSISTED TOTAL HYSTERECTOMY (N/A) - Dr. Marcello Fennel not available,must start @ 0830/need c-arm table CYSTOSCOPY WITH RETROGRADE PYELOGRAM (Bilateral) - with lighted bil stents as a surgical intervention .  The patient's history has been reviewed, patient examined, no change in status, stable for surgery.  I have reviewed the patient's chart and labs.  Questions were answered to the patient's satisfaction.     Nicholle Falzon A

## 2012-03-19 NOTE — Consult Note (Signed)
Urology Consult  Referring physician: cc: Dr. Estanislado Pandy Reason for referral:  Pelvic kidney pre-op hysterectomy  Chief Complaint:   Pelvic kidney  History of Present Illness:   42 yo female referred by Dr. Estanislado Pandy for evaluatrion of pelvic kidney found on pre-op evaluation prior to hysterectomy. She is planning for robotic hysterectomy and lighted stents. She is post ablation for continued vaginal bleeding, but denies bladder or hx of G-U problems. No gross hematuiria, no stones. CT accomplished in [facility], and not available for evaluation until day of surgery.   Past Medical History  Diagnosis Date  . Anemia   . Hypertension   . Head ache   . History of chicken pox   . Pelvic kidney   . History of blood transfusion 01/2012    at [facility]  . Complication of anesthesia     difficult to awaken  . SVD (spontaneous vaginal delivery)     x 4   Past Surgical History  Procedure Laterality Date  . Hysteroscopy    . Tubal ligation    . Cholecystectomy    . Dilitation & currettage/hystroscopy with thermachoice ablation  02/26/2012    Procedure: DILATATION & CURETTAGE/HYSTEROSCOPY WITH THERMACHOICE ABLATION;  Surgeon: Michael Litter, MD;  Location: [facility];  Service: Gynecology;  Laterality: N/A;    Medications: I have reviewed the patient's current medications. Allergies:  Allergies  Allergen Reactions  . Dilaudid (Hydromorphone Hcl) Itching    Itching and rash  . Hydrocodone Nausea And Vomiting    Patient will not take this  . Ultram (Tramadol) Itching and Nausea And Vomiting  . Codeine Itching and Rash    Family History  Problem Relation Age of Onset  . Stroke Father   . Hypertension Father   . Diabetes Father   . Heart disease Mother   . Breast cancer Mother   . Thyroid disease Mother   . Anemia Mother   . Diabetes Mother   . Diabetes Brother   . Asthma Daughter   . Diabetes Paternal Aunt   . Diabetes Maternal Aunt   . Diabetes Maternal Uncle   . Diabetes Paternal Uncle     Social History:  reports that she has never smoked. She has never used smokeless tobacco. She reports that she does not drink alcohol or use illicit drugs.  ROS: All systems are reviewed and negative except as noted. Vaginal bleeding.  Physical Exam:  Vital signs in last 24 hours: Temp:  [98.4 F (36.9 C)] 98.4 F (36.9 C) (02/20 0735) Pulse Rate:  [79] 79 (02/20 0735) Resp:  [18] 18 (02/20 0735) BP: (129)/(78) 129/78 mmHg (02/20 0735) SpO2:  [100 %] 100 % (02/20 0735) Weight:  [131.543 kg (290 lb)] 131.543 kg (290 lb) (02/20 0735)  Cardiovascular: Skin warm; not flushed Respiratory: Breaths quiet; no shortness of breath Abdomen: No masses Neurological: Normal sensation to touch Musculoskeletal: Normal motor function arms and legs Lymphatics: No inguinal adenopathy Skin: No rashes Genitourinary: Normal BUS. Non-painful bladder.   Laboratory Data:  Results for orders placed during the hospital encounter of 03/19/12 (from the past 72 hour(s))  CBC     Status: Abnormal   Collection Time    03/19/12  7:50 AM      Result Value Range   WBC 8.6  4.0 - 10.5 K/uL   RBC 4.31  3.87 - 5.11 MIL/uL   Hemoglobin 10.8 (*) 12.0 - 15.0 g/dL   HCT 40.9 (*) 81.1 - 91.4 %   MCV 80.0  78.0 - 100.0 fL   MCH 25.1 (*) 26.0 - 34.0 pg   MCHC 31.3  30.0 - 36.0 g/dL   RDW 91.4 (*) 78.2 - 95.6 %   Platelets 283  150 - 400 K/uL  HCG, SERUM, QUALITATIVE     Status: None   Collection Time    03/19/12  7:50 AM      Result Value Range   Preg, Serum NEGATIVE  NEGATIVE   Comment:            THE SENSITIVITY OF THIS     METHODOLOGY IS >10 mIU/mL.   No results found for this or any previous visit (from the past 240 hour(s)). Creatinine: No results found for this basename: CREATININE,  in the last 168 hours  Xrays: See report/chart  [facility] report from [facility] : Ectopic Left pelvic kidney; Right ureteral duplication  Impression/Assessment:  Pt will have cysto and bilateral retrogrades for lighted stent  placement.   Plan:   For lighted stents prior to robotic hysterectomy.   Keshun Berrett I 03/19/2012, 10:02 AM

## 2012-03-19 NOTE — H&P (Signed)
Reason for admission:    Menorrhagia    History:        Destiny Delgado is a 42 y.o. female, G4P4, presenting today to undergo a robotically assisted total hysterectomy for fibroids with menorrhagia non-responsive to medical management. Patient failed management with BCP, cyclic progesterone supplementation, hysteroscopy with polypectomy and IV Premarin. She required transfusion of 3 units of PRC in January after Premarin therapy. Despite maintenance hormonal therapy, she presented with heavy bleeding on 02/26/12 where she was treated with hysteroscopy, D&C and Thermachoice ablation.    Pathology report revealed: Endometrium, curettage - ABUNDANT BLOOD AND SCANTY DEGENERATING SECRETORY ENDOMETRIUM WITH STROMAL BREAKDOWN. - ABUNDANT BENIGN ENDOCERVICAL FRAGMENTS WITH NO EVIDENCE OF DYSPLASIA OR MALIGNANCY.  After reviewing all treatment options, Destiny Delgado elected to pursue definitive treatment with robotically assisted total hysterectomy with ovarian preservation.    Review of system:       Pertinent items in the HPI Cardiovascular: negative. No SOB. No cough GI: lack of appetite with occasional nausea Urinary: negative Extremities: denies swelling or pain   Past Medical History:       Past Medical History   Diagnosis  Date   .  Anemia     .  Hypertension     .  Head ache     .  History of chicken pox     .  Pelvic kidney        Allergies     .  Hydrocodone  Nausea And Vomiting       Patient will not take this   .  Codeine  Rash     Social History:        Marland Kitchen  Marital Status:  Married       Spouse Name:  N/A       Number of Children:  N/A   .  Years of Education:  N/A     .  Smoking status:  Never Smoker    .  Smokeless tobacco:  Never Used   .  Alcohol Use:  No   .  Drug Use:  No   .  Sexually Active:  Yes -- Female partner(s)       Birth Control/ Protection:  Surgical         Comment: BTL       Family History:    .  Stroke  Father     .  Hypertension   Father     .  Diabetes  Father     .  Heart disease  Mother     .  Breast cancer  Mother     .  Thyroid disease  Mother     .  Anemia  Mother     .  Diabetes  Mother     .  Diabetes  Brother     .  Asthma  Daughter     .  Diabetes  Paternal Aunt     .  Diabetes  Maternal Aunt     .  Diabetes  Maternal Uncle     .  Diabetes  Paternal Uncle           Physical exam:     VS are normal with normal orthostatics General Appearance: Alert, appropriate appearance for age. Appears uncomfortable Neck / Thyroid: Supple, no masses, nodes or enlargement Lungs: clear to auscultation bilaterally Back: No CVA tenderness. Cardiovascular: Regular rate and rhythm. S1, S2, no murmur Gastrointestinal: Soft, non-tender, no masses or organomegaly Pelvic  Exam: deferred Extremities: no evidence of DVT   Ultrasound 01/14/13: essentially normal   Assessment:    1.Menorrhagia refractory to multiple treatment modalities with severe anemia requiring multiple transfusion 2. Left pelvic kidney    Plan:    Robotically-assisted total hysterectomy with ovarian preservation after placement of ureteral stents   Benefits of the robotic approach include lesser postoperative pain, less blood loss during surgery, reduced risk of injury to other organs due to better visualization with a 3-D HD 10 times magnifying camera, shorter hospital stay between 0-1 night and rapid recovery with return to daily routine in 2-3 weeks. Although robotically-assisted hysterectomy has a longer operative time than traditional laparotomy, in a patient with good medical history, the benefits usually outweigh the risks.   Risks include bleeding, infection, injury to other organs, need for laparotomy, transient post-operative facial edema, increased risk of pelvic prolapse associated with any hysterectomy as well as earlier  onset of menopause. Preservation or preventative removal of the ovaries was also reviewed and left to the  patient's discretion. Finally, the option of supracervical hysterectomy was also discussed with the possible but yet unconfirmed benefits of reduction of pelvic prolapse. If supracervical hysterectomy is performed, Pap smear screening would continue as currently recommended and a small but possible risk of cervical fibroid development is present.  All questions were answered. Marland Kitchen

## 2012-03-19 NOTE — Op Note (Signed)
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 Patsi Sears, MD  First assistant:  None  Anesthesia:  general  Preparation:  After appropriate preanesthesia, the patient was brought to the operating room, placed on the operating table in the dorsal supine position where general endotracheal anesthesia was introduced. She was replaced in the dorsal lithotomy position with pubis was prepped with Betadine solution and draped in usual fashion. The armband was double checked. CT scan was reviewed. The patient's history was also reviewed.  Review history:  History of Present Illness: 42 yo female referred by Dr. Estanislado Pandy for evaluatrion of pelvic kidney found on pre-op evaluation prior to hysterectomy. She is planning for robotic hysterectomy and lighted stents. She is post ablation for continued vaginal bleeding, but denies bladder or hx of G-U problems. No gross hematuiria, no stones. CT accomplished [facility], and not available for evaluation until day of surgery.    CT reviewerd: Chest wnl. No CT pelvis loaded on disc.    Statement of  Likelihood of Success: Excellent. TIME-OUT observed.:  Procedure:  Cystourethroscopy was accomplished, which showed a large amount of trigonal granulation. The right ureter was in normal position on the trigone. The left ureter was somewhat laterally displaced on the trigone. After identifying the ureteral orifices, it was noted that there was no evidence of bladder stone or tumor or diverticular formation.  Right retrograde pyelogram showed a solitary ureter. Within the kidney, there was no hydronephrosis, or caliectasis. There were no stones or tumors. The right upper pole  infundibulum was elongated, and appeared to be a duplication of the upper pole collecting system, which drained into the renal pelvis. The lower pole was normal renal anatomy.  Retrograde pyelogram the left side showed ectopic left pelvic kidney. The kidney appeared normal in its internal architecture, however.  Bilateral ureteral catheters were placed, with ureteral wire is. These dense were removed, and over the guidewires, the sheath for the lighted stent was placed bilaterally. These were placed to mark noting 20 cm. The scope was removed, and bilaterally, and the lighted internal portion of the stent was placed bilaterally. These were taped in place. Repeat cystoscopy marked the ureters at 20 cm, and this was again taped. The lighted stents were taped together, and then plugged into the light box. 16 French Foley catheter with 10 cc in balloon was placed without trauma.  The patient then underwent robotic hysterectomy. She will have stents removed at the end of procedure.

## 2012-03-20 ENCOUNTER — Encounter (HOSPITAL_COMMUNITY): Payer: Self-pay | Admitting: Obstetrics and Gynecology

## 2012-03-20 NOTE — Op Note (Signed)
NAMEMarland Kitchen  AMA, MCMASTER NO.:  [id number]  MEDICAL RECORD NO.:  [id number]  LOCATION:  9306                          FACILITY:  [facility]  PHYSICIAN:  Crist Fat. Levoy Geisen, M.D. DATE OF BIRTH:  11-Dec-1970  DATE OF PROCEDURE:  03/19/2012 DATE OF DISCHARGE:                              OPERATIVE REPORT   PREOPERATIVE DIAGNOSIS:  Menorrhagia refractory to medical management with known left pelvic kidney.  POSTOPERATIVE DIAGNOSIS:  Menorrhagia refractory to medical management with known left pelvic kidney.  ANESTHESIA:  General, Dana Allan, MD  PROCEDURES: 1. Robotically assisted total hysterectomy with bilateral     salpingectomy. 2. Placement of preoperative ureteral stents by Dr. Patsi Sears.  SURGEON:  Crist Fat. Mayes Sangiovanni, MD  ASSISTANT:  Elmira J. Lowell Guitar, PA  ESTIMATED BLOOD LOSS:  100 mL.  DESCRIPTION OF PROCEDURE:  After being informed of the planned procedure with possible complications including bleeding, infection, injury to other organs, need for laparotomy, extended operative time, expected hospital stay and recovery, informed consent was obtained and the patient was taken to OR #7 and given general anesthesia with endotracheal intubation without any complications.  She was placed in the lithotomy position on a sticky mattress and beanbag with both arms padded and tucked on each side and knee-high sequential compressive devices.  She was prepped and draped in a sterile fashion.  Dr. Patsi Sears proceeded with cystoscopy and placement of lighted ureteral stents due to the abnormal renal anatomy.  After his procedure was completed, a pelvic exam was performed, revealing an enlarged uterus approximately 12-14 weeks in size with 2 normal adnexa.  A weighted speculum was inserted in the vagina and the anterior lip of the cervix was grasped with a tenaculum forceps.  The uterus was sounded at 9 cm and the cervix was easily dilated using Hegar dilator until #35.   This allows for easy placement of the RUMI intrauterine manipulator #8.  For which, its balloon was inflated with 10 mL of saline.  This was placed with a 4.0 KOH ring and a vaginal occluder.  The KOH ring was sutured to the cervix with 2 simple sutures of 0 Vicryl.  We proceeded with infiltration of the supraumbilical area using 10 mL of Marcaine 0.25 and performed a 10-mm vertical incision which was brought down bluntly to the fascia.  The fascia was identified and grasped with Kocher forceps and incised with Mayo scissors.  Peritoneum was entered bluntly.  A pursestring suture of 0 Vicryl was placed on the fascia and a 10-mm Hasson trocar was easily inserted in the abdominal cavity, held in place with the pursestring suture.  This allows for easy insufflation of a pneumoperitoneum using warmed CO2 at a maximum pressure of 15 mmHg.  A first observation of the pelvic cavity using the robotic camera revealed a normal anterior cul-de-sac, a bulky globulus uterus with no apparent fibroids, a normal posterior cul-de-sac.  Both tubes showed signs of previous tubal ligation.  Right ovary is normal.  Left ovary has a small simple functional cyst measuring 2 cm.  We do note a small 1- cm paratubal cyst on the right tube.  At this time, it is evident that the left  kidney is in a pelvic location just about at the level of the fundus.  Due to the placed lighted stent, it is easy to identify both ureters all the way to the bladder.  Trocar placement was then decided and after infiltrating each side with Marcaine 0.25, we positioned two 8-mm robotic trocar on the left, one 8- mm robotic trocar on the right, and one 5-mm patient side assistant trocar on the right.  All trocars were inserted under direct visualization.  The robot was docked on the left side of the patient.  A monopolar scissor was inserted in arm #1, a PK Gyrus forceps was inserted in arm #2, and a ProGrasp forceps was inserted in  arm #3. Preparation and docking was completed in 49 minutes.  We proceeded my beginning on the left side and cauterizing the left tubal ovarian ligament and sectioning it, cauterizing the left tube and sectioning it, cauterizing the left round ligament and section it.  This gives [place] easy entry into the broad ligament and parametrial space.  The anterior sheath of the broad ligament was easily dissected down all the way to around the lower uterine segment at the section of the KOH ring across to the right side.  The parametrial space was developed and the posterior sheath of the broad ligament was dissected down sharply all the way to the level of the KOH ring, having the left ureter under direct visualization.  We can then proceed with skeletonization of the uterine vessels and cauterized them at the level of the KOH ring with pressure applied on the KOH ring and the left ureter under direct visualization.  The bladder had been completely dissected down below the level of the KOH ring where we can identify the vaginal fornix after inflating the bladder with 200 mL of saline and deflating it before cauterizing the blood vessels.  We then moved to the right side and proceeded with cauterization of the right utero-ovarian ligament and sectioned it and cauterization of the right round ligament and sectioned it.  In the process, we decided to proceed with a bilateral salpingectomy to reduce the ovarian cancer risk for the patient.  The right tube was then dissected completely from its mesosalpinx and was left attached to the uterus.  The anterior sheath of the broad ligament was entered sharply to meet its contralateral incision and we finished dissecting the bladder on the right angle using blunt and sharp dissection.  The posterior sheath of the broad ligament was dissected sharply all the way to the KOH ring.  This allows for easy skeletonization of the uterine vessels, having the ureter  under direct visualization.  We can then cauterize these vessels at the ascending branch of the uterine artery at the level of the KOH ring.  These vessels were sectioned.  The vaginal occluder was inflated and we proceeded with opening of the vaginal anterior and posterior fornix at the level of the KOH ring with an open monopolar scissors.  This was achieved easily and the uterus was freed completely.  It was extracted through the vagina and the vaginal occluder was reinserted to maintain the pneumoperitoneum.  The remnant of the left tube was then dissected away from the ovary by cauterizing and sectioning the mesosalpinx and it was delivered through the vaginal opening.  Instruments were then modified for a suture cut in arm #1, long forceps in arm #2, and PK Gyrus forceps in arm #3.  Four sutures of 0 Vicryl were dropped  through the 10-mm port and attached to the anterior wall. They were used in the sequence in order to close the vaginal cuff using figure-of-eight stitches of 0 Vicryl.  All the remaining needles were attached to the anterior abdominal wall, awaiting extractions at the end of the procedure.  We then irrigated profusely with warm saline to confirm satisfactory hemostasis and still 2 normal ureters which were still equipped with lighted stents.  A sheet of Interceed was deposited on the vaginal cuff, divided in 2, and all robotic instruments were then removed and the robot was undocked.  Changing to an 8-mm robotic camera, we switched our port of entry and were able to remove all 4 needles through the 10-mm port.  All trocars were then removed under direct visualization after evacuating the pneumoperitoneum.  The fascia of the supraumbilical incision was closed with the previously placed pursestring suture of 0 Vicryl.  The incisions were then all closed with subcuticular suture of 3-0 Monocryl and Dermabond.  A speculum exam of the vaginal vault reveals  satisfactory closure and hemostasis.  Instruments and sponge count was complete x2.  Estimated blood loss was approximately 100 mL.  The procedure was very well tolerated by the patient, who was taken to the recovery room in a well and stable condition.  Specimen comprising uterus and both tubes was weighed at 248.7 g and was sent to Pathology.     Crist Fat Nayeli Calvert, M.D.     SAR/MEDQ  D:  03/19/2012  T:  03/20/2012  Job:  [id number]

## 2012-03-20 NOTE — Anesthesia Postprocedure Evaluation (Signed)
  Anesthesia Post-op Note  Patient: Destiny Delgado  Procedure(s) Performed: Procedure(s) with comments: ROBOTIC ASSISTED TOTAL HYSTERECTOMY (N/A) CYSTOSCOPY WITH RETROGRADE PYELOGRAM (Bilateral) - with lighted bil stents BILATERAL SALPINGECTOMY (Bilateral) - robotic assisted   Patient Location: [facility]  Anesthesia Type:General  Level of Consciousness: awake, alert , oriented and patient cooperative  Airway and Oxygen Therapy: Patient Spontanous Breathing  Post-op Pain: moderate  Post-op Assessment: Patient's Cardiovascular Status Stable, Respiratory Function Stable, No signs of Nausea or vomiting and Pain level controlled  Post-op Vital Signs: stable  Complications: No apparent anesthesia complications

## 2012-03-20 NOTE — Progress Notes (Addendum)
Destiny Delgado is a60 y.o.  [id number]  Post Op Date # 1;  S/P Robot Assisted Laparoscopic Hysterectomy/Retrograde Pyelogram with Placement/Removal of Lighted Ureteral Stents/Bilateral Salpingectomy  Subjective: Patient is Doing well postoperatively. Just complains of pain when the pain medicine begins to wear off. Ambulaing without difficulty but admits to occasional lightheadedness with ambulation.  Patient has Tolerating oral medications and crackers. Has been drinking water and ginger ale along with eating ice without nausea or vomiting., Voiding without difficulty and has passed flatus.  Objective: Vital signs in last 24 hours: Temp:  [97.7 F (36.5 C)-98.7 F (37.1 C)] 98.4 F (36.9 C) (02/21 0515) Pulse Rate:  [75-112] 75 (02/21 0515) Resp:  [13-22] 20 (02/21 0515) BP: (106-166)/(54-87) 129/84 mmHg (02/21 0515) SpO2:  [95 %-100 %] 95 % (02/21 0515)  Intake/Output from previous day: 02/20 0701 - 02/21 0700 In: 3535 [P.O.:360; I.V.:3175] Out: 2540 [Urine:2390] Intake/Output this shift:    Recent Labs Lab 03/19/12 0750  WBC 8.6  HGB 10.8*  HCT 34.5*  PLT 283    No results found for this basename: NA, K, CL, CO2, BUN, CREATININE, CALCIUM, LABALBU, PROT, BILITOT, ALKPHOS, ALT, AST, GLUCOSE,  in the last 168 hours  EXAM: General: alert, cooperative and no distress Resp: clear to auscultation bilaterally Cardio: regular rate and rhythm, S1, S2 normal, no murmur, click, rub or gallop GI: Soft, bowel sounds present, incisions without evidence of infection and intact. Extremities: No tenderness in calves and negative Homan's Vaginal Bleeding: faint stain   Assessment: s/p Procedure(s): ROBOTIC ASSISTED TOTAL HYSTERECTOMY CYSTOSCOPY WITH RETROGRADE PYELOGRAM BILATERAL SALPINGECTOMY: stable and anemia  Plan: Discharge home  LOS: 1 day    POWELL,ELMIRA, PA-C 03/20/2012 7:54 AM  Pt seen. Questions answered. Post-op instructions reviewed.

## 2012-03-20 NOTE — Progress Notes (Signed)
This was a follow-up visit; the family met Chaplain Rush Barer when they were here in the hospital previously.  Ishi reported that she is feeling much better today and that she feels positive and hopeful about her healing.  She and her husband were so appreciative of Lisa's support from their earlier conversation and they were grateful that we remembered to check in on them.  [address] [place] Pager, [phone] 1:11 PM   03/20/12 1300  Clinical Encounter Type  Visited With Patient;Patient and family together  Visit Type Follow-up  Referral From Chaplain

## 2012-03-20 NOTE — Progress Notes (Signed)
Pt is discharged in the care of husband. Downstairs per ambulatory. Stable. Denies pain or discomfort. Stable. Lapsites abdominal are clean and dry.  Denies heavy vaginal bleeding. Discharge instructions with Rx were given to pt with good understanding. Questions were asked and answered.

## 2012-04-01 ENCOUNTER — Encounter: Payer: Self-pay | Admitting: Obstetrics and Gynecology

## 2012-04-01 ENCOUNTER — Ambulatory Visit: Payer: BC Managed Care – PPO | Admitting: Obstetrics and Gynecology

## 2012-04-01 VITALS — BP 124/80 | Ht 71.0 in | Wt 297.0 lb

## 2012-04-01 NOTE — Progress Notes (Signed)
Surgery: Robotic Hysterectomy   Date: 03/19/2012  Eating a regular diet without difficulty. Bowel movements are normal.  Pain controlled with Dilaudid  Bladder function is returned to normal. Vaginal bleeding: flow about like a period. Pt states started today. Vaginal discharge: no vaginal discharge    Subjective:     Destiny Delgado is a 42 y.o. female who presents for post-op visit.  Pathology report:  was reviewed with patient.  Uterus and bilateral fallopian tubes, with cervix - CERVIX: UNREMARKABLE. - ENDOMETRIUM: FIBROSIS AND HEMORRHAGE. NO HYPERPLASIA OR CARCINOMA. - MYOMETRIUM: LEIOMYOMATA. - RIGHT AND LEFT FALLOPIAN TUBES: FINDINGS CONSISTENT WITH PREVIOUS TUBAL LIGATION.  The following portions of the patient's history were reviewed and updated as appropriate: allergies, current medications, past family history, past medical history, past social history, past surgical history and problem list.  Review of Systems Pertinent items are noted in HPI.   Objective:    BP 124/80  Ht 5\' 11"  (1.803 m)  Wt 297 lb (134.718 kg)  BMI 41.44 kg/m2  LMP 03/11/2012 Weight:  Wt Readings from Last 1 Encounters:  04/01/12 297 lb (134.718 kg)    BMI: Body mass index is 41.44 kg/(m^2).  General Appearance: Alert, appropriate appearance for age. No acute distress Lungs: clear to auscultation bilaterally Back: No CVA tenderness Cardiovascular: Regular rate and rhythm. S1, S2, no murmur Gastrointestinal: Soft, non-tender, no masses or organomegaly Incision/s: healing well Pelvic Exam: vaginal cuff with oozing in the middle of the suture line controlled with Monsel. Cuff otherwise healing well    Assessment:    Doing well postoperatively. Operative findings again reviewed.   Plan:   1 week f/u No changes in activity level Discontinue Dilaudid. Take Ibuprofen only   Silverio Lay MD 3/5/201411:49 AM

## 2012-04-09 ENCOUNTER — Encounter: Payer: Self-pay | Admitting: Obstetrics and Gynecology

## 2012-04-09 ENCOUNTER — Ambulatory Visit: Payer: BC Managed Care – PPO | Admitting: Obstetrics and Gynecology

## 2012-04-09 VITALS — BP 118/78 | Ht 71.0 in | Wt 297.0 lb

## 2012-04-09 NOTE — Progress Notes (Signed)
Surgery: Robotic Hysterectomy  Date: 03/19/2012  Eating a regular diet without difficulty. Bowel movements are normal.  Pain is controlled with current analgesics. Medications being used: ibuprofen (OTC).  Bladder function is returned to normal. Vaginal bleeding: pt states it stopped 04/06/2012 Vaginal discharge: no vaginal discharge    Subjective:     Destiny Delgado is a 42 y.o. female who presents for post-op visit.  Pathology report:  was reviewed with patient.  The following portions of the patient's history were reviewed and updated as appropriate: allergies, current medications, past family history, past medical history, past social history, past surgical history and problem list.  Review of Systems Pertinent items are noted in HPI.   Objective:    BP 118/78  Ht 5\' 11"  (1.803 m)  Wt 297 lb (134.718 kg)  BMI 41.44 kg/m2  LMP 03/11/2012 Weight:  Wt Readings from Last 1 Encounters:  04/09/12 297 lb (134.718 kg)    BMI: Body mass index is 41.44 kg/(m^2).  General Appearance: Alert, appropriate appearance for age. No acute distress Incision/s: healing well 2 suture removed Pelvic Exam: vaginal cuff healing well   Bimanual exam normal  Assessment:    Doing well postoperatively. Operative findings again reviewed.   Plan:   No intercourse for 3 more week RTW part time starting Monday-Saturday Start full time after that   Silverio Lay MD 3/13/20143:27 PM

## 2013-11-09 ENCOUNTER — Emergency Department (HOSPITAL_COMMUNITY)
Admission: EM | Admit: 2013-11-09 | Discharge: 2013-11-09 | Disposition: A | Discharge status: Home / Self Care | Payer: BC Managed Care – PPO | Attending: Emergency Medicine | Admitting: Emergency Medicine

## 2013-11-09 ENCOUNTER — Emergency Department (HOSPITAL_COMMUNITY): Payer: BC Managed Care – PPO

## 2013-11-09 ENCOUNTER — Other Ambulatory Visit: Payer: Self-pay

## 2013-11-09 ENCOUNTER — Encounter (HOSPITAL_COMMUNITY): Payer: Self-pay | Admitting: Emergency Medicine

## 2013-11-09 DIAGNOSIS — R531 Weakness: Secondary | ICD-10-CM | POA: Insufficient documentation

## 2013-11-09 DIAGNOSIS — J159 Unspecified bacterial pneumonia: Secondary | ICD-10-CM | POA: Diagnosis not present

## 2013-11-09 DIAGNOSIS — E669 Obesity, unspecified: Secondary | ICD-10-CM | POA: Diagnosis not present

## 2013-11-09 DIAGNOSIS — R062 Wheezing: Secondary | ICD-10-CM | POA: Insufficient documentation

## 2013-11-09 DIAGNOSIS — Z862 Personal history of diseases of the blood and blood-forming organs and certain disorders involving the immune mechanism: Secondary | ICD-10-CM | POA: Diagnosis not present

## 2013-11-09 DIAGNOSIS — Z8619 Personal history of other infectious and parasitic diseases: Secondary | ICD-10-CM | POA: Diagnosis not present

## 2013-11-09 DIAGNOSIS — Z791 Long term (current) use of non-steroidal anti-inflammatories (NSAID): Secondary | ICD-10-CM | POA: Diagnosis not present

## 2013-11-09 DIAGNOSIS — R079 Chest pain, unspecified: Secondary | ICD-10-CM | POA: Diagnosis present

## 2013-11-09 DIAGNOSIS — I10 Essential (primary) hypertension: Secondary | ICD-10-CM | POA: Insufficient documentation

## 2013-11-09 DIAGNOSIS — Z79899 Other long term (current) drug therapy: Secondary | ICD-10-CM | POA: Insufficient documentation

## 2013-11-09 DIAGNOSIS — J189 Pneumonia, unspecified organism: Secondary | ICD-10-CM

## 2013-11-09 LAB — BASIC METABOLIC PANEL
Anion gap: 10 (ref 5–15)
BUN: 7 mg/dL (ref 6–23)
CALCIUM: 9 mg/dL (ref 8.4–10.5)
CO2: 28 mEq/L (ref 19–32)
CREATININE: 0.86 mg/dL (ref 0.50–1.10)
Chloride: 102 mEq/L (ref 96–112)
GFR, EST NON AFRICAN AMERICAN: 82 mL/min — AB (ref >90)
Glucose, Bld: 108 mg/dL — ABNORMAL HIGH (ref 70–99)
Potassium: 4.1 mEq/L (ref 3.7–5.3)
Sodium: 140 mEq/L (ref 137–147)

## 2013-11-09 LAB — CBC
HEMATOCRIT: 36.8 % (ref 36.0–46.0)
Hemoglobin: 12.3 g/dL (ref 12.0–15.0)
MCH: 28.1 pg (ref 26.0–34.0)
MCHC: 33.4 g/dL (ref 30.0–36.0)
MCV: 84.2 fL (ref 78.0–100.0)
PLATELETS: 237 10*3/uL (ref 150–400)
RBC: 4.37 MIL/uL (ref 3.87–5.11)
RDW: 13.3 % (ref 11.5–15.5)
WBC: 6.6 10*3/uL (ref 4.0–10.5)

## 2013-11-09 LAB — I-STAT TROPONIN, ED: Troponin i, poc: 0 ng/mL (ref 0.00–0.08)

## 2013-11-09 MED ORDER — ALBUTEROL SULFATE HFA 108 (90 BASE) MCG/ACT IN AERS: 2.0000 | INHALATION_SPRAY | RESPIRATORY_TRACT | Status: DC | PRN

## 2013-11-09 MED ORDER — ALBUTEROL SULFATE (2.5 MG/3ML) 0.083% IN NEBU
5.0000 mg | INHALATION_SOLUTION | Freq: Once | RESPIRATORY_TRACT | Status: AC
  Administered 2013-11-09: 5 mg via RESPIRATORY_TRACT
  Filled 2013-11-09: qty 6

## 2013-11-09 MED ORDER — LEVOFLOXACIN 750 MG PO TABS: 750.0000 mg | ORAL_TABLET | Freq: Every day | ORAL | Status: DC

## 2013-11-09 MED ORDER — PREDNISONE 20 MG PO TABS: 40.0000 mg | ORAL_TABLET | Freq: Every day | ORAL | Status: DC

## 2013-11-09 MED ORDER — FENTANYL CITRATE 0.05 MG/ML IJ SOLN
50.0000 ug | Freq: Once | INTRAMUSCULAR | Status: AC
  Administered 2013-11-09: 50 ug via INTRAVENOUS
  Filled 2013-11-09: qty 2

## 2013-11-09 MED ORDER — PROMETHAZINE HCL 25 MG/ML IJ SOLN
25.0000 mg | Freq: Once | INTRAMUSCULAR | Status: AC
  Administered 2013-11-09: 25 mg via INTRAVENOUS
  Filled 2013-11-09: qty 1

## 2013-11-09 MED ORDER — IPRATROPIUM-ALBUTEROL 0.5-2.5 (3) MG/3ML IN SOLN
3.0000 mL | Freq: Once | RESPIRATORY_TRACT | Status: AC
  Administered 2013-11-09: 3 mL via RESPIRATORY_TRACT
  Filled 2013-11-09: qty 3

## 2013-11-09 MED ORDER — DIPHENHYDRAMINE HCL 50 MG/ML IJ SOLN
25.0000 mg | Freq: Once | INTRAMUSCULAR | Status: AC
  Administered 2013-11-09: 25 mg via INTRAVENOUS
  Filled 2013-11-09: qty 1

## 2013-11-09 MED ORDER — LEVOFLOXACIN IN D5W 750 MG/150ML IV SOLN
750.0000 mg | Freq: Once | INTRAVENOUS | Status: AC
  Administered 2013-11-09: 750 mg via INTRAVENOUS
  Filled 2013-11-09: qty 150

## 2013-11-09 NOTE — Discharge Instructions (Signed)

## 2013-11-09 NOTE — ED Provider Notes (Signed)
CSN: [id number]     Arrival date & time 11/09/13  1517 History   First MD Initiated Contact with Patient 11/09/13 1721     Chief Complaint  Patient presents with  . Weakness  . Chest Pain     (Consider location/radiation/quality/duration/timing/severity/associated sxs/prior Treatment) HPI Comments: Patient is a 43 year old female with history of hypertension, anemia, obesity presents to the emergency department for evaluation of chest pain or shortness of breath. She reports that her symptoms began on Saturday evening. On Sunday she was seen in the emergency department in [place]. She was given doxycycline when she was there. Her symptoms have not improved. The patient has productive cough and feels generally weak. She denies fevers, chills, nausea, vomiting, abdominal pain.  Patient is a 43 y.o. female presenting with weakness and chest pain. The history is provided by the patient. No language interpreter was used.  Weakness Associated symptoms include chest pain, coughing, fatigue and weakness (generalized). Pertinent negatives include no abdominal pain, chills, fever, nausea or vomiting.  Chest Pain Associated symptoms: cough, fatigue, shortness of breath and weakness (generalized)   Associated symptoms: no abdominal pain, no fever, no nausea and not vomiting     Past Medical History  Diagnosis Date  . Anemia   . Hypertension   . Head ache   . History of chicken pox   . Pelvic kidney   . History of blood transfusion 01/2012    at [facility]  . Complication of anesthesia     difficult to awaken  . SVD (spontaneous vaginal delivery)     x 4  . Obesity    Past Surgical History  Procedure Laterality Date  . Hysteroscopy    . Tubal ligation    . Cholecystectomy    . Dilitation & currettage/hystroscopy with thermachoice ablation  02/26/2012    Procedure: DILATATION & CURETTAGE/HYSTEROSCOPY WITH THERMACHOICE ABLATION;  Surgeon: Michael Litter, MD;  Location: [facility];  Service:  Gynecology;  Laterality: N/A;  . Robotic assisted total hysterectomy N/A 03/19/2012    Procedure: ROBOTIC ASSISTED TOTAL HYSTERECTOMY;  Surgeon: Esmeralda Arthur, MD;  Location: [facility];  Service: Gynecology;  Laterality: N/A;  . Bilateral salpingectomy Bilateral 03/19/2012    Procedure: BILATERAL SALPINGECTOMY;  Surgeon: Esmeralda Arthur, MD;  Location: [facility];  Service: Gynecology;  Laterality: Bilateral;  robotic assisted   . Cystoscopy w/ retrogrades Bilateral 03/19/2012    Procedure: CYSTOSCOPY WITH RETROGRADE PYELOGRAM;  Surgeon: Kathi Ludwig, MD;  Location: [facility];  Service: Urology;  Laterality: Bilateral;  with lighted bil stents   Family History  Problem Relation Age of Onset  . Stroke Father   . Hypertension Father   . Diabetes Father   . Heart disease Mother   . Breast cancer Mother   . Thyroid disease Mother   . Anemia Mother   . Diabetes Mother   . Diabetes Brother   . Asthma Daughter   . Diabetes Paternal Aunt   . Diabetes Maternal Aunt   . Diabetes Maternal Uncle   . Diabetes Paternal Uncle    History  Substance Use Topics  . Smoking status: Never Smoker   . Smokeless tobacco: Never Used  . Alcohol Use: No   OB History   Grav Para Term Preterm Abortions TAB SAB Ect Mult Living   4 4        4      Review of Systems  Constitutional: Positive for fatigue. Negative for fever, chills and appetite change.  Respiratory:  Positive for cough and shortness of breath.   Cardiovascular: Positive for chest pain.  Gastrointestinal: Negative for nausea, vomiting and abdominal pain.  Neurological: Positive for weakness (generalized).  All other systems reviewed and are negative.     Allergies  Dilaudid; Hydrocodone; Ultram; and Codeine  Home Medications   Prior to Admission medications   Medication Sig Start Date End Date Taking? Authorizing Provider  doxycycline (ADOXA) 100 MG tablet Take 100 mg by mouth 2 (two) times daily. 11/07/13  Yes Historical Provider, MD   HYDROcodone-homatropine (HYCODAN) 5-1.5 MG/5ML syrup Take 5 mLs by mouth daily as needed for cough.  11/07/13  Yes Historical Provider, MD  ibuprofen (ADVIL,MOTRIN) 200 MG tablet Take 400 mg by mouth every 6 (six) hours as needed for moderate pain.   Yes Historical Provider, MD  lisinopril (PRINIVIL,ZESTRIL) 10 MG tablet Take 10 mg by mouth daily.   Yes Historical Provider, MD  Multiple Vitamins-Minerals (ALIVE ONCE DAILY WOMENS 50+) TABS Take 1 tablet by mouth daily.   Yes Historical Provider, MD  naproxen sodium (ANAPROX) 220 MG tablet Take 220 mg by mouth 2 (two) times daily as needed (for pain).   Yes Historical Provider, MD   BP 154/106  Pulse 65  Temp(Src) 98.5 F (36.9 C) (Oral)  Resp 18  SpO2 99%  LMP 03/11/2012 Physical Exam  Nursing note and vitals reviewed. Constitutional: She is oriented to person, place, and time. She appears well-developed and well-nourished. No distress.  HENT:  Head: Normocephalic and atraumatic.  Right Ear: External ear normal.  Left Ear: External ear normal.  Nose: Nose normal.  Mouth/Throat: Oropharynx is clear and moist.  Eyes: Conjunctivae are normal.  Neck: Normal range of motion.  No nuchal rigidity or meningeal signs  Cardiovascular: Normal rate, regular rhythm, normal heart sounds, intact distal pulses and normal pulses.   Pulses:      Radial pulses are 2+ on the right side, and 2+ on the left side.       Posterior tibial pulses are 2+ on the right side, and 2+ on the left side.  Pulmonary/Chest: Effort normal. No stridor. No respiratory distress. She has wheezes. She has rales.  Abdominal: Soft. She exhibits no distension.  Musculoskeletal: Normal range of motion.  Neurological: She is alert and oriented to person, place, and time. She has normal strength.  Skin: Skin is warm and dry. She is not diaphoretic. No erythema.  Psychiatric: She has a normal mood and affect. Her behavior is normal.    ED Course  Procedures (including  critical care time) Labs Review Labs Reviewed  BASIC METABOLIC PANEL - Abnormal; Notable for the following:    Glucose, Bld 108 (*)    GFR calc non Af Amer 82 (*)    All other components within normal limits  CBC  I-STAT TROPOININ, ED    Imaging Review Dg Chest 2 View  11/09/2013   CLINICAL DATA:  Weakness, chest pain, shortness of breath, recent pneumonia at 3 days ago  EXAM: CHEST  2 VIEW  COMPARISON:  11/07/2013  FINDINGS: Cardiomediastinal silhouette is stable. Persistent streaky left perihilar infiltrate with slight improvement in aeration from prior exam. No new infiltrate or pulmonary edema.  IMPRESSION: Persistent streaky left perihilar infiltrate with slight improvement in aeration. No new infiltrate or pulmonary edema.   Electronically Signed   By: Natasha MeadLiviu  Pop M.D.   On: 11/09/2013 15:55     EKG Interpretation   Date/Time:  Tuesday November 09 2013 15:26:44 EDT Ventricular Rate:  71 PR Interval:  146 QRS Duration: 82 QT Interval:  406 QTC Calculation: 441 R Axis:   46 Text Interpretation:  Normal sinus rhythm with sinus arrhythmia Normal ECG  ED PHYSICIAN INTERPRETATION AVAILABLE IN [facility] Confirmed by  TEST, Record ([id number]) on 11/11/2013 7:04:52 AM      MDM   Final diagnoses:  Community acquired pneumonia    Patient has been diagnosed with CAP via chest xray. Pt is not ill appearing, immunocompromised, and does not have multiple co morbidities, therefore I feel like the they can be treated as an OP with abx therapy. Pt has been advised to return to the ED if symptoms worsen or they do not improve. Pt verbalizes understanding and is agreeable with plan.      Mora BellmanHannah S Kennidee Heyne, PA-C 11/11/13 1056

## 2013-11-09 NOTE — ED Notes (Addendum)
Presents with DX of PNA on Sunday in [place], since Sunday reports feeling worse with worsening chest pain on left side and into neck, increased weakness and fatigue. Breath sounds clear. Denies fevers. Sats 97% RA. Pt is pale.  Reports productive cough.

## 2013-11-09 NOTE — ED Notes (Signed)
Pt sat 99% while ambulating

## 2013-11-09 NOTE — ED Notes (Signed)
Pt ambulated to the bathroom.  

## 2013-11-17 NOTE — ED Provider Notes (Signed)
Medical screening examination/treatment/procedure(s) were performed by non-physician practitioner and as supervising physician I was immediately available for consultation/collaboration.   EKG Interpretation   Date/Time:  Tuesday November 09 2013 15:26:44 EDT Ventricular Rate:  71 PR Interval:  146 QRS Duration: 82 QT Interval:  406 QTC Calculation: 441 R Axis:   46 Text Interpretation:  Normal sinus rhythm with sinus arrhythmia Normal ECG  ED PHYSICIAN INTERPRETATION AVAILABLE IN [facility] Confirmed by  TEST, Record ([id number]) on 11/11/2013 7:04:52 AM        Rolland PorterMark Annsleigh Dragoo, MD 11/17/13 2313

## 2013-11-29 ENCOUNTER — Encounter (HOSPITAL_COMMUNITY): Payer: Self-pay | Admitting: Emergency Medicine

## 2014-07-19 ENCOUNTER — Ambulatory Visit: Payer: Self-pay | Admitting: Orthopedic Surgery

## 2015-04-15 ENCOUNTER — Encounter (HOSPITAL_COMMUNITY): Payer: Self-pay | Admitting: Emergency Medicine

## 2015-04-15 ENCOUNTER — Emergency Department (HOSPITAL_COMMUNITY)
Admission: EM | Admit: 2015-04-15 | Discharge: 2015-04-15 | Disposition: A | Discharge status: Home / Self Care | Payer: Self-pay | Attending: Emergency Medicine | Admitting: Emergency Medicine

## 2015-04-15 ENCOUNTER — Emergency Department (HOSPITAL_COMMUNITY): Payer: Self-pay

## 2015-04-15 DIAGNOSIS — Z862 Personal history of diseases of the blood and blood-forming organs and certain disorders involving the immune mechanism: Secondary | ICD-10-CM | POA: Insufficient documentation

## 2015-04-15 DIAGNOSIS — J069 Acute upper respiratory infection, unspecified: Secondary | ICD-10-CM | POA: Insufficient documentation

## 2015-04-15 DIAGNOSIS — Q632 Ectopic kidney: Secondary | ICD-10-CM | POA: Insufficient documentation

## 2015-04-15 DIAGNOSIS — Z79899 Other long term (current) drug therapy: Secondary | ICD-10-CM | POA: Insufficient documentation

## 2015-04-15 DIAGNOSIS — R110 Nausea: Secondary | ICD-10-CM | POA: Insufficient documentation

## 2015-04-15 DIAGNOSIS — E669 Obesity, unspecified: Secondary | ICD-10-CM | POA: Insufficient documentation

## 2015-04-15 DIAGNOSIS — Z8619 Personal history of other infectious and parasitic diseases: Secondary | ICD-10-CM | POA: Insufficient documentation

## 2015-04-15 DIAGNOSIS — I10 Essential (primary) hypertension: Secondary | ICD-10-CM | POA: Insufficient documentation

## 2015-04-15 MED ORDER — BENZONATATE 100 MG PO CAPS: 100.0000 mg | ORAL_CAPSULE | Freq: Three times a day (TID) | ORAL | Status: DC

## 2015-04-15 MED ORDER — GUAIFENESIN ER 600 MG PO TB12: 600.0000 mg | ORAL_TABLET | Freq: Two times a day (BID) | ORAL | Status: DC

## 2015-04-15 NOTE — Discharge Instructions (Signed)
Upper Respiratory Infection, Adult Most upper respiratory infections (URIs) are a viral infection of the air passages leading to the lungs. A URI affects the nose, throat, and upper air passages. The most common type of URI is nasopharyngitis and is typically referred to as "the common cold." URIs run their course and usually go away on their own. Most of the time, a URI does not require medical attention, but sometimes a bacterial infection in the upper airways can follow a viral infection. This is called a secondary infection. Sinus and middle ear infections are common types of secondary upper respiratory infections. Bacterial pneumonia can also complicate a URI. A URI can worsen asthma and chronic obstructive pulmonary disease (COPD). Sometimes, these complications can require emergency medical care and may be life threatening.  CAUSES Almost all URIs are caused by viruses. A virus is a type of germ and can spread from one person to another.  RISKS FACTORS You may be at risk for a URI if:   You smoke.   You have chronic heart or lung disease.  You have a weakened defense (immune) system.   You are very young or very old.   You have nasal allergies or asthma.  You work in crowded or poorly ventilated areas.  You work in health care facilities or schools. SIGNS AND SYMPTOMS  Symptoms typically develop 2-3 days after you come in contact with a cold virus. Most viral URIs last 7-10 days. However, viral URIs from the influenza virus (flu virus) can last 14-18 days and are typically more severe. Symptoms may include:   Runny or stuffy (congested) nose.   Sneezing.   Cough.   Sore throat.   Headache.   Fatigue.   Fever.   Loss of appetite.   Pain in your forehead, behind your eyes, and over your cheekbones (sinus pain).  Muscle aches.  DIAGNOSIS  Your health care provider may diagnose a URI by:  Physical exam.  Tests to check that your symptoms are not due to  another condition such as:  Strep throat.  Sinusitis.  Pneumonia.  Asthma. TREATMENT  A URI goes away on its own with time. It cannot be cured with medicines, but medicines may be prescribed or recommended to relieve symptoms. Medicines may help:  Reduce your fever.  Reduce your cough.  Relieve nasal congestion. HOME CARE INSTRUCTIONS   Take medicines only as directed by your health care provider.   Gargle warm saltwater or take cough drops to comfort your throat as directed by your health care provider.  Use a warm mist humidifier or inhale steam from a shower to increase air moisture. This may make it easier to breathe.  Drink enough fluid to keep your urine clear or pale yellow.   Eat soups and other clear broths and maintain good nutrition.   Rest as needed.   Return to work when your temperature has returned to normal or as your health care provider advises. You may need to stay home longer to avoid infecting others. You can also use a face mask and careful hand washing to prevent spread of the virus.  Increase the usage of your inhaler if you have asthma.   Do not use any tobacco products, including cigarettes, chewing tobacco, or electronic cigarettes. If you need help quitting, ask your health care provider. PREVENTION  The best way to protect yourself from getting a cold is to practice good hygiene.   Avoid oral or hand contact with people with cold   symptoms.   Wash your hands often if contact occurs.  There is no clear evidence that vitamin C, vitamin E, echinacea, or exercise reduces the chance of developing a cold. However, it is always recommended to get plenty of rest, exercise, and practice good nutrition.  SEEK MEDICAL CARE IF:   You are getting worse rather than better.   Your symptoms are not controlled by medicine.   You have chills.  You have worsening shortness of breath.  You have brown or red mucus.  You have yellow or brown nasal  discharge.  You have pain in your face, especially when you bend forward.  You have a fever.  You have swollen neck glands.  You have pain while swallowing.  You have white areas in the back of your throat. SEEK IMMEDIATE MEDICAL CARE IF:   You have severe or persistent:  Headache.  Ear pain.  Sinus pain.  Chest pain.  You have chronic lung disease and any of the following:  Wheezing.  Prolonged cough.  Coughing up blood.  A change in your usual mucus.  You have a stiff neck.  You have changes in your:  Vision.  Hearing.  Thinking.  Mood. MAKE SURE YOU:   Understand these instructions.  Will watch your condition.  Will get help right away if you are not doing well or get worse.   This information is not intended to replace advice given to you by your health care provider. Make sure you discuss any questions you have with your health care provider.   Document Released: 07/10/2000 Document Revised: 05/31/2014 Document Reviewed: 04/21/2013 Elsevier Interactive Patient Education 2016 [employer]  

## 2015-04-15 NOTE — ED Provider Notes (Signed)
CSN: [id number]     Arrival date & time 04/15/15  1402 History   First MD Initiated Contact with Patient 04/15/15 1620     Chief Complaint  Patient presents with  . Cough  . Headache  . Nausea   HPI Pt has not been feeling well for the past week.  She has been having a cough and congestion.  She feels a wheezing in her chest.  She has been having body aches and chills.   She feels worse at night when she is lying flat.  No chest pain.  She had a fever up to 103.  No vomiting but she has been nauseated.  She has tried OTC meds without relief. Past Medical History  Diagnosis Date  . Anemia   . Hypertension   . Head ache   . History of chicken pox   . Pelvic kidney   . [facility] of [facility]/2012    [facility]  . Complication of anesthesia     difficult to awaken  . SVD (spontaneous vaginal delivery)     x 4  . Obesity    Past Surgical History  Procedure Laterality Date  . Hysteroscopy    . Tubal ligation    . Cholecystectomy    . Dilitation & currettage/hystroscopy with thermachoice ablation  02/26/2012    Procedure: DILATATION & CURETTAGE/HYSTEROSCOPY WITH THERMACHOICE ABLATION;  Surgeon: Michael LitterNaima A Dillard, MD;  Location: [facility];  Service: Gynecology;  Laterality: N/A;  . Robotic assisted total hysterectomy N/A 03/19/2012    Procedure: ROBOTIC ASSISTED TOTAL HYSTERECTOMY;  Surgeon: Esmeralda ArthurSandra A Rivard, MD;  Location: [facility];  Service: Gynecology;  Laterality: N/A;  . Bilateral salpingectomy Bilateral 03/19/2012    Procedure: BILATERAL SALPINGECTOMY;  Surgeon: Esmeralda ArthurSandra A Rivard, MD;  Location: [facility];  Service: Gynecology;  Laterality: Bilateral;  robotic assisted   . Cystoscopy w/ retrogrades Bilateral 03/19/2012    Procedure: CYSTOSCOPY WITH RETROGRADE PYELOGRAM;  Surgeon: Kathi LudwigSigmund I Tannenbaum, MD;  Location: [facility];  Service: Urology;  Laterality: Bilateral;  with lighted bil stents   Family History  Problem Relation Age of Onset  . Stroke Father   . Hypertension Father    . Diabetes Father   . Heart disease Mother   . Breast cancer Mother   . Thyroid disease Mother   . Anemia Mother   . Diabetes Mother   . Diabetes Brother   . Asthma Daughter   . Diabetes Paternal Aunt   . Diabetes Maternal Aunt   . Diabetes Maternal Uncle   . Diabetes Paternal Uncle    Social History  Substance Use Topics  . Smoking status: Never Smoker   . Smokeless tobacco: Never Used  . Alcohol Use: No   OB History    Gravida Para Term Preterm AB TAB SAB Ectopic Multiple Living   4 4        4      Review of Systems  All other systems reviewed and are negative.     Allergies  Dilaudid; Hydrocodone; Ultram; and Codeine  Home Medications   Prior to Admission medications   Medication Sig Start Date End Date Taking? Authorizing Provider  ibuprofen (ADVIL,MOTRIN) 200 MG tablet Take 400 mg by mouth every 6 (six) hours as needed for moderate pain.   Yes Historical Provider, MD  lisinopril (PRINIVIL,ZESTRIL) 10 MG tablet Take 10 mg by mouth daily.   Yes Historical Provider, MD  albuterol (PROVENTIL HFA;VENTOLIN HFA) 108 (90 BASE) MCG/ACT inhaler Inhale 2  puffs into the lungs every 2 (two) hours as needed for wheezing or shortness of breath (cough). Patient not taking: Reported on 04/15/2015 11/09/13   Junious Silk, PA-C  benzonatate (TESSALON) 100 MG capsule Take 1 capsule (100 mg total) by mouth every 8 (eight) hours. 04/15/15   Linwood Dibbles, MD  guaiFENesin (MUCINEX) 600 MG 12 hr tablet Take 1 tablet (600 mg total) by mouth 2 (two) times daily. 04/15/15   Linwood Dibbles, MD   BP 152/96 mmHg  Pulse 61  Temp(Src) 99 F (37.2 C) (Oral)  Resp 18  SpO2 100%  LMP 03/11/2012 Physical Exam  Constitutional: No distress.  Obese   HENT:  Head: Normocephalic and atraumatic.  Right Ear: External ear normal.  Left Ear: External ear normal.  Eyes: Conjunctivae are normal. Right eye exhibits no discharge. Left eye exhibits no discharge. No scleral icterus.  Neck: Neck supple. No  tracheal deviation present.  Cardiovascular: Normal rate, regular rhythm and intact distal pulses.   Pulmonary/Chest: Effort normal and breath sounds normal. No stridor. No respiratory distress. She has no wheezes. She has no rales.  Abdominal: Soft. Bowel sounds are normal. She exhibits no distension. There is no tenderness. There is no rebound and no guarding.  Musculoskeletal: She exhibits no edema or tenderness.  Neurological: She is alert. She has normal strength. No cranial nerve deficit (no facial droop, extraocular movements intact, no slurred speech) or sensory deficit. She exhibits normal muscle tone. She displays no seizure activity. Coordination normal.  Skin: Skin is warm and dry. No rash noted.  Psychiatric: She has a normal mood and affect.  Nursing note and vitals reviewed.   ED Course  Procedures (including critical care time) Labs Review Labs Reviewed - No data to display  Imaging Review Dg Chest 2 View  04/15/2015  CLINICAL DATA:  body ache and chill and fever x 2 days; hx PNA; left arm numbness and tingling x 2 days; EXAM: CHEST  2 VIEW COMPARISON:  11/09/2013 FINDINGS: The heart size and mediastinal contours are within normal limits. Both lungs are clear. The visualized skeletal structures are unremarkable. IMPRESSION: No active cardiopulmonary disease. Electronically Signed   By: Esperanza Heir M.D.   On: 04/15/2015 17:29   I have personally reviewed and evaluated these images and lab results as part of my medical decision-making.    MDM   Final diagnoses:  Acute URI    Symptoms are consistent with an upper respiratory infection, Possible influenza-like illness.CXR without PNA.  I discussed supportive treatment. I encouraged followup with the primary care doctor next week if symptoms have not resolved. Warning signs and reasons to return to the emergency room were discussed      Linwood Dibbles, MD 04/15/15 1816

## 2015-04-15 NOTE — ED Notes (Signed)
Patient here from home with complaints of chills, generalized body aches, headache, nausea x3 days. OTC meds with no relief.

## 2015-10-24 ENCOUNTER — Encounter (HOSPITAL_COMMUNITY): Payer: Self-pay

## 2015-10-24 ENCOUNTER — Emergency Department (HOSPITAL_COMMUNITY): Payer: Self-pay

## 2015-10-24 ENCOUNTER — Emergency Department (HOSPITAL_COMMUNITY)
Admission: EM | Admit: 2015-10-24 | Discharge: 2015-10-24 | Disposition: A | Discharge status: Home / Self Care | Payer: Self-pay | Attending: Emergency Medicine | Admitting: Emergency Medicine

## 2015-10-24 DIAGNOSIS — I10 Essential (primary) hypertension: Secondary | ICD-10-CM | POA: Insufficient documentation

## 2015-10-24 DIAGNOSIS — Z79899 Other long term (current) drug therapy: Secondary | ICD-10-CM | POA: Insufficient documentation

## 2015-10-24 DIAGNOSIS — J189 Pneumonia, unspecified organism: Secondary | ICD-10-CM | POA: Insufficient documentation

## 2015-10-24 MED ORDER — AZITHROMYCIN 250 MG PO TABS: 250.0000 mg | ORAL_TABLET | Freq: Every day | ORAL | 0 refills | Status: DC

## 2015-10-24 MED ORDER — LISINOPRIL 10 MG PO TABS: 10.0000 mg | ORAL_TABLET | Freq: Every day | ORAL | 0 refills | Status: DC

## 2015-10-24 NOTE — Discharge Instructions (Signed)
Please take all of your antibiotics until finished! Follow up with your doctor in regards to your hospital visit. You may return to the emergency department if symptoms worsen, become progressive, or become more concerning.  Pneumonia, Adult Pneumonia is an infection of the lungs.   CAUSES Pneumonia may be caused by bacteria or a virus. Usually, these infections are caused by breathing infectious particles into the lungs (respiratory tract).  SYMPTOMS  Cough.  Fever.  Chest pain.  Increased rate of breathing.  Wheezing.  Mucus production.   DIAGNOSIS  If you have the common symptoms of pneumonia, your caregiver will typically confirm the diagnosis with a chest X-ray. The X-ray will show an abnormality in the lung (pulmonary infiltrate) if you have pneumonia. Other tests of your blood, urine, or sputum may be done to find the specific cause of your pneumonia. Your caregiver may also do tests (blood gases or pulse oximetry) to see how well your lungs are working.  TREATMENT  Some forms of pneumonia may be spread to other people when you cough or sneeze. You may be asked to wear a mask before and during your exam. Pneumonia that is caused by bacteria is treated with antibiotic medicine. Pneumonia that is caused by the influenza virus may be treated with an antiviral medicine. Most other viral infections must run their course. These infections will not respond to antibiotics.   PREVENTION A pneumococcal shot (vaccine) is available to prevent a common bacterial cause of pneumonia. This is usually suggested for: People over 22 years old.  Patients on chemotherapy.  People with chronic lung problems, such as bronchitis or emphysema.  People with immune system problems.  If you are over 65 or have a high risk condition, you may receive the pneumococcal vaccine if you have not received it before. In some countries, a routine influenza vaccine is also recommended. This vaccine can help prevent some  cases of pneumonia. You may be offered the influenza vaccine as part of your care. If you smoke, it is time to quit. You may receive instructions on how to stop smoking. Your caregiver can provide medicines and counseling to help you quit.  HOME CARE INSTRUCTIONS  Cough suppressants may be used if you are losing too much rest. However, coughing protects you by clearing your lungs. You should avoid using cough suppressants if you can.  Your caregiver may have prescribed medicine if he or she thinks your pneumonia is caused by a bacteria or influenza. Finish your medicine even if you start to feel better.  Your caregiver may also prescribe an expectorant. This loosens the mucus to be coughed up.  Only take over-the-counter or prescription medicines for pain, discomfort, or fever as directed by your caregiver.  Do not smoke. Smoking is a common cause of bronchitis and can contribute to pneumonia. If you are a smoker and continue to smoke, your cough may last several weeks after your pneumonia has cleared.  A cold steam vaporizer or humidifier in your room or home may help loosen mucus.  Coughing is often worse at night. Sleeping in a semi-upright position in a recliner or using a couple pillows under your head will help with this.  Get rest as you feel it is needed. Your body will usually let you know when you need to rest.   SEEK IMMEDIATE MEDICAL CARE IF:  Your illness becomes worse. This is especially true if you are elderly or weakened from any other disease.  You cannot control your  cough with suppressants and are losing sleep.  You begin coughing up blood.  You develop pain which is getting worse or is uncontrolled with medicines.  You have a fever.  Any of the symptoms which initially brought you in for treatment are getting worse rather than better.  You develop shortness of breath or chest pain.   MAKE SURE YOU:  Understand these instructions.  Will watch your condition.  Will get help  right away if you are not doing well or get worse.

## 2015-10-24 NOTE — ED Triage Notes (Signed)
Pt reports URI symptoms X2 weeks. Pt reports intermittent body aches and chills. She reports she now has chest pain, worse when she coughs.

## 2015-10-24 NOTE — ED Provider Notes (Signed)
[facility] DEPT Provider Note   CSN: [id number] Arrival date & time: 10/24/15  0857     History   Chief Complaint Chief Complaint  Patient presents with  . URI  . Chest Pain    HPI Destiny Delgado is a 45 y.o. female.  The history is provided by the patient and medical records. No language interpreter was used.  URI   Associated symptoms include chest pain, congestion and cough. Pertinent negatives include no abdominal pain, no nausea, no vomiting, no dysuria, no headaches, no neck pain and no rash.  Chest Pain   Associated symptoms include cough and a fever. Pertinent negatives include no abdominal pain, no back pain, no headaches, no nausea, no palpitations, no shortness of breath and no vomiting.   Destiny Delgado is a 45 y.o. female  with a PMH of HTN presents to the Emergency Department complaining of productive cough and nasal congestion 2 weeks which has been acutely worsening over the last 5 days. Associated symptoms include fever Tmax of 100.1. Ibuprofen with good fever control. Mucinex with little relief. Denies difficulty breathing, back pain, abdominal pain.   Past Medical History:  Diagnosis Date  . Anemia   . Complication of anesthesia    difficult to awaken  . Head ache   . [facility]   [facility]  . History of chicken pox   . Hypertension   . Obesity   . Pelvic kidney   . SVD (spontaneous vaginal delivery)    x 4    Patient Active Problem List   Diagnosis Date Noted  . Hypertension 01/15/2012  . Head ache   . History of chicken pox     Past Surgical History:  Procedure Laterality Date  . ABDOMINAL HYSTERECTOMY    . BILATERAL SALPINGECTOMY Bilateral 03/19/2012   Procedure: BILATERAL SALPINGECTOMY;  Surgeon: Esmeralda ArthurSandra A Rivard, MD;  Location: [facility];  Service: Gynecology;  Laterality: Bilateral;  robotic assisted   . CHOLECYSTECTOMY    . CYSTOSCOPY W/ RETROGRADES Bilateral 03/19/2012   Procedure: CYSTOSCOPY WITH  RETROGRADE PYELOGRAM;  Surgeon: Kathi LudwigSigmund I Tannenbaum, MD;  Location: [facility];  Service: Urology;  Laterality: Bilateral;  with lighted bil stents  . DILITATION & CURRETTAGE/HYSTROSCOPY WITH THERMACHOICE ABLATION  02/26/2012   Procedure: DILATATION & CURETTAGE/HYSTEROSCOPY WITH THERMACHOICE ABLATION;  Surgeon: Michael LitterNaima A Dillard, MD;  Location: [facility];  Service: Gynecology;  Laterality: N/A;  . HYSTEROSCOPY    . ROBOTIC ASSISTED TOTAL HYSTERECTOMY N/A 03/19/2012   Procedure: ROBOTIC ASSISTED TOTAL HYSTERECTOMY;  Surgeon: Esmeralda ArthurSandra A Rivard, MD;  Location: [facility];  Service: Gynecology;  Laterality: N/A;  . TUBAL LIGATION      OB History    Gravida Para Term Preterm AB Living   4 4       4    SAB TAB Ectopic Multiple Live Births                   Home Medications    Prior to Admission medications   Medication Sig Start Date End Date Taking? Authorizing Provider  albuterol (PROVENTIL HFA;VENTOLIN HFA) 108 (90 BASE) MCG/ACT inhaler Inhale 2 puffs into the lungs every 2 (two) hours as needed for wheezing or shortness of breath (cough). Patient not taking: Reported on 04/15/2015 11/09/13   Junious SilkHannah Merrell, PA-C  azithromycin (ZITHROMAX) 250 MG tablet Take 1 tablet (250 mg total) by mouth daily. Take first 2 tablets together, then 1 every day until finished. 10/24/15   Marijean NiemannJaime  Pilcher Ward, PA-C  benzonatate (TESSALON) 100 MG capsule Take 1 capsule (100 mg total) by mouth every 8 (eight) hours. 04/15/15   Linwood Dibbles, MD  guaiFENesin (MUCINEX) 600 MG 12 hr tablet Take 1 tablet (600 mg total) by mouth 2 (two) times daily. 04/15/15   Linwood Dibbles, MD  ibuprofen (ADVIL,MOTRIN) 200 MG tablet Take 400 mg by mouth every 6 (six) hours as needed for moderate pain.    Historical Provider, MD  lisinopril (PRINIVIL,ZESTRIL) 10 MG tablet Take 1 tablet (10 mg total) by mouth daily. 10/24/15   Chase Picket Ward, PA-C    Family History Family History  Problem Relation Age of Onset  . Stroke Father   . Hypertension Father   .  Diabetes Father   . Heart disease Mother   . Breast cancer Mother   . Thyroid disease Mother   . Anemia Mother   . Diabetes Mother   . Diabetes Brother   . Asthma Daughter   . Diabetes Paternal Aunt   . Diabetes Maternal Aunt   . Diabetes Maternal Uncle   . Diabetes Paternal Uncle     Social History Social History  Substance Use Topics  . Smoking status: Never Smoker  . Smokeless tobacco: Never Used  . Alcohol use No     Allergies   Dilaudid [hydromorphone hcl]; Hydrocodone; Ultram [tramadol]; and Codeine   Review of Systems Review of Systems  Constitutional: Positive for fever.  HENT: Positive for congestion.   Eyes: Negative for visual disturbance.  Respiratory: Positive for cough. Negative for shortness of breath.   Cardiovascular: Positive for chest pain. Negative for palpitations and leg swelling.  Gastrointestinal: Negative for abdominal pain, nausea and vomiting.  Genitourinary: Negative for dysuria.  Musculoskeletal: Negative for back pain and neck pain.  Skin: Negative for rash.  Neurological: Negative for headaches.     Physical Exam Updated Vital Signs BP 146/86   Pulse 64   Temp 98.5 F (36.9 C) (Oral)   Resp 19   Ht 5\' 11"  (1.803 m)   Wt 131.5 kg   LMP 03/11/2012   SpO2 100%   BMI 40.45 kg/m   Physical Exam  Constitutional: She is oriented to person, place, and time. She appears well-developed and well-nourished. No distress.  HENT:  Head: Normocephalic and atraumatic.  Cardiovascular: Normal rate, regular rhythm, normal heart sounds and intact distal pulses.  Exam reveals no gallop and no friction rub.   No murmur heard. Pulmonary/Chest: Effort normal. No respiratory distress.  Crackles in right lung, left clear. No wheezing. No chest tenderness. 100% O2 on RA and speaking in full sentences.   Abdominal: Soft. She exhibits no distension. There is no tenderness.  Musculoskeletal: Normal range of motion.  Neurological: She is alert and  oriented to person, place, and time.  Skin: Skin is warm and dry.  Nursing note and vitals reviewed.    ED Treatments / Results  Labs (all labs ordered are listed, but only abnormal results are displayed) Labs Reviewed - No data to display  EKG  EKG Interpretation  Date/Time:  Tuesday October 24 2015 09:01:18 EDT Ventricular Rate:  69 PR Interval:  148 QRS Duration: 78 QT Interval:  416 QTC Calculation: 445 R Axis:   43 Text Interpretation:  Normal sinus rhythm Normal ECG No significant change since last tracing Confirmed by [facility] MD, Barbara Cower [phone]) on 10/24/2015 11:32:44 AM       Radiology Dg Chest 2 View  Result Date: 10/24/2015 CLINICAL DATA:  Cough. EXAM: CHEST  2 VIEW COMPARISON:  04/15/2015 . FINDINGS: Mediastinum and hilar structures normal. Minimal right base infiltrate cannot be excluded. No pleural effusion or pneumothorax. Heart size normal. No acute bony abnormality . IMPRESSION: Minimal right base pulmonary infiltrate cannot be excluded. Otherwise no acute cardiopulmonary disease noted. Electronically Signed   By: Maisie Fus  Register   On: 10/24/2015 09:32    Procedures Procedures (including critical care time)  Medications Ordered in ED Medications - No data to display   Initial Impression / Assessment and Plan / ED Course  I have reviewed the triage vital signs and the nursing notes.  Pertinent labs & imaging results that were available during my care of the patient were reviewed by me and considered in my medical decision making (see chart for details).  Clinical Course   Destiny Delgado is a 45 y.o. female with pmh of HTN who presents to ED for cough/congestion/fever x 2 weeks that has been worsening over the last 5 days. Patient is afebrile and 100% O2 on room air. She does have crackles of her right lung on exam. Chest x-ray was obtained which shows possible right pulmonary base infiltrate. Will treat with Zithromax. Patient is also requesting a refill  of her lisinopril which was provided. PCP follow-up encouraged. Reasons to return to ED discussed and all questions answered.  Final Clinical Impressions(s) / ED Diagnoses   Final diagnoses:  CAP (community acquired pneumonia)    New Prescriptions Discharge Medication List as of 10/24/2015 12:12 PM    START taking these medications   Details  azithromycin (ZITHROMAX) 250 MG tablet Take 1 tablet (250 mg total) by mouth daily. Take first 2 tablets together, then 1 every day until finished., Starting Tue 10/24/2015, Print         [employer] Ward, PA-C 10/24/15 1221    Marily Memos, MD 10/25/15 [phone]

## 2015-10-29 ENCOUNTER — Emergency Department (HOSPITAL_COMMUNITY): Payer: Self-pay

## 2015-10-29 ENCOUNTER — Encounter (HOSPITAL_COMMUNITY): Payer: Self-pay

## 2015-10-29 ENCOUNTER — Emergency Department (HOSPITAL_COMMUNITY)
Admission: EM | Admit: 2015-10-29 | Discharge: 2015-10-30 | Disposition: A | Discharge status: Home / Self Care | Payer: Self-pay | Attending: Emergency Medicine | Admitting: Emergency Medicine

## 2015-10-29 DIAGNOSIS — I10 Essential (primary) hypertension: Secondary | ICD-10-CM | POA: Insufficient documentation

## 2015-10-29 DIAGNOSIS — J069 Acute upper respiratory infection, unspecified: Secondary | ICD-10-CM | POA: Insufficient documentation

## 2015-10-29 DIAGNOSIS — Z79899 Other long term (current) drug therapy: Secondary | ICD-10-CM | POA: Insufficient documentation

## 2015-10-29 LAB — URINALYSIS, ROUTINE W REFLEX MICROSCOPIC
Bilirubin Urine: NEGATIVE
Glucose, UA: NEGATIVE mg/dL
Hgb urine dipstick: NEGATIVE
Ketones, ur: NEGATIVE mg/dL
Leukocytes, UA: NEGATIVE
Nitrite: NEGATIVE
Protein, ur: NEGATIVE mg/dL
Specific Gravity, Urine: 1.025 (ref 1.005–1.030)
pH: 6 (ref 5.0–8.0)

## 2015-10-29 LAB — CBC WITH DIFFERENTIAL/PLATELET
Basophils Absolute: 0 10*3/uL (ref 0.0–0.1)
Basophils Relative: 0 %
Eosinophils Absolute: 0.2 10*3/uL (ref 0.0–0.7)
Eosinophils Relative: 2 %
HCT: 37.3 % (ref 36.0–46.0)
Hemoglobin: 11.9 g/dL — ABNORMAL LOW (ref 12.0–15.0)
Lymphocytes Relative: 36 %
Lymphs Abs: 3 10*3/uL (ref 0.7–4.0)
MCH: 28.3 pg (ref 26.0–34.0)
MCHC: 31.9 g/dL (ref 30.0–36.0)
MCV: 88.8 fL (ref 78.0–100.0)
Monocytes Absolute: 0.6 10*3/uL (ref 0.1–1.0)
Monocytes Relative: 8 %
Neutro Abs: 4.5 10*3/uL (ref 1.7–7.7)
Neutrophils Relative %: 54 %
Platelets: 269 10*3/uL (ref 150–400)
RBC: 4.2 MIL/uL (ref 3.87–5.11)
RDW: 13.2 % (ref 11.5–15.5)
WBC: 8.3 10*3/uL (ref 4.0–10.5)

## 2015-10-29 LAB — BASIC METABOLIC PANEL
Anion gap: 5 (ref 5–15)
BUN: 9 mg/dL (ref 6–20)
CO2: 30 mmol/L (ref 22–32)
Calcium: 8.9 mg/dL (ref 8.9–10.3)
Chloride: 104 mmol/L (ref 101–111)
Creatinine, Ser: 0.99 mg/dL (ref 0.44–1.00)
GFR calc Af Amer: >60 mL/min (ref >60)
GFR calc non Af Amer: >60 mL/min (ref >60)
Glucose, Bld: 113 mg/dL — ABNORMAL HIGH (ref 65–99)
Potassium: 4.2 mmol/L (ref 3.5–5.1)
Sodium: 139 mmol/L (ref 135–145)

## 2015-10-29 LAB — I-STAT TROPONIN, ED: Troponin i, poc: 0 ng/mL (ref 0.00–0.08)

## 2015-10-29 NOTE — ED Notes (Signed)
Patient transported to X-ray 

## 2015-10-29 NOTE — ED Provider Notes (Signed)
[facility] Provider Note   CSN: [id number] Arrival date & time: 10/29/15  1959     History   Chief Complaint Chief Complaint  Patient presents with  . Cough    HPI Destiny Delgado is a 45 y.o. female with recent diagnosis of pneumonia on 10/24/2015 who presents with continued shortness of breath and wheezing. Patient has had increasing fatigue, generalized weakness, nausea with eating, decreased appetite as well as chills and sweats. She is unsure if she has had a temperature at home. Patient was given azithromycin, which she has completed. She states her cough has improved to some degree, however she is still coughing. Patient has associated chest pain that she describes as a pressure and is central. She had this 5 days ago at diagnosis, however it continues. Patient has also had new urinary frequency although she has not been drinking very much. Patient had a couple bouts of diarrhea the past couple days as well. Patient denies any abdominal pain or vomiting. She also reports swelling of her feet over the past few days. She denies any leg pain or swelling, recent long trips, recent surgeries, or exogenous estrogen use. Of note, patient recently found a lump on her breast and had [place] mamm[place] last week in [place]. She has not been informed of results yet. She has had associated right breast pain with this.  HPI  Past Medical History:  Diagnosis Date  . Anemia   . Complication of anesthesia    difficult to awaken  . Head ache   . [facility]   [facility]  . History of chicken pox   . Hypertension   . Obesity   . Pelvic kidney   . SVD (spontaneous vaginal delivery)    x 4    Patient Active Problem List   Diagnosis Date Noted  . Hypertension 01/15/2012  . Head ache   . History of chicken pox     Past Surgical History:  Procedure Laterality Date  . ABDOMINAL HYSTERECTOMY    . BILATERAL SALPINGECTOMY Bilateral 03/19/2012   Procedure:  BILATERAL SALPINGECTOMY;  Surgeon: Esmeralda ArthurSandra A Rivard, MD;  Location: [facility];  Service: Gynecology;  Laterality: Bilateral;  robotic assisted   . CHOLECYSTECTOMY    . CYSTOSCOPY W/ RETROGRADES Bilateral 03/19/2012   Procedure: CYSTOSCOPY WITH RETROGRADE PYELOGRAM;  Surgeon: Kathi LudwigSigmund I Tannenbaum, MD;  Location: [facility];  Service: Urology;  Laterality: Bilateral;  with lighted bil stents  . DILITATION & CURRETTAGE/HYSTROSCOPY WITH THERMACHOICE ABLATION  02/26/2012   Procedure: DILATATION & CURETTAGE/HYSTEROSCOPY WITH THERMACHOICE ABLATION;  Surgeon: Michael LitterNaima A Dillard, MD;  Location: [facility];  Service: Gynecology;  Laterality: N/A;  . HYSTEROSCOPY    . ROBOTIC ASSISTED TOTAL HYSTERECTOMY N/A 03/19/2012   Procedure: ROBOTIC ASSISTED TOTAL HYSTERECTOMY;  Surgeon: Esmeralda ArthurSandra A Rivard, MD;  Location: [facility];  Service: Gynecology;  Laterality: N/A;  . TUBAL LIGATION      OB History    Gravida Para Term Preterm AB Living   4 4       4    SAB TAB Ectopic Multiple Live Births                   Home Medications    Prior to Admission medications   Medication Sig Start Date End Date Taking? Authorizing Provider  albuterol (PROVENTIL HFA;VENTOLIN HFA) 108 (90 BASE) MCG/ACT inhaler Inhale 2 puffs into the lungs every 2 (two) hours as needed for wheezing or shortness of breath (cough).  11/09/13  Yes Junious Silk, PA-C  ibuprofen (ADVIL,MOTRIN) 200 MG tablet Take 400 mg by mouth every 6 (six) hours as needed for moderate pain.   Yes Historical Provider, MD  lisinopril (PRINIVIL,ZESTRIL) 10 MG tablet Take 1 tablet (10 mg total) by mouth daily. 10/24/15  Yes Jaime Pilcher Ward, PA-C  dextromethorphan-guaiFENesin [facility] DM) 30-600 MG 12hr tablet Take 1 tablet by mouth 2 (two) times daily as needed for cough. 10/30/15   Emi Holes, PA-C  predniSONE (DELTASONE) 20 MG tablet Take 1 tablet (20 mg total) by mouth daily. 10/30/15   Emi Holes, PA-C    Family History Family History  Problem Relation Age of Onset    . Stroke Father   . Hypertension Father   . Diabetes Father   . Heart disease Mother   . Breast cancer Mother   . Thyroid disease Mother   . Anemia Mother   . Diabetes Mother   . Diabetes Brother   . Asthma Daughter   . Diabetes Paternal Aunt   . Diabetes Maternal Aunt   . Diabetes Maternal Uncle   . Diabetes Paternal Uncle     Social History Social History  Substance Use Topics  . Smoking status: Never Smoker  . Smokeless tobacco: Never Used  . Alcohol use No     Allergies   Dilaudid [hydromorphone hcl]; Hydrocodone; Ultram [tramadol]; and Codeine   Review of Systems Review of Systems  Constitutional: Negative for chills and fever.  HENT: Negative for facial swelling and sore throat.   Respiratory: Positive for cough, shortness of breath and wheezing.   Cardiovascular: Positive for chest pain.  Gastrointestinal: Positive for diarrhea and nausea. Negative for abdominal pain and vomiting.  Genitourinary: Positive for frequency. Negative for dysuria.  Musculoskeletal: Negative for back pain.  Skin: Negative for rash and wound.  Neurological: Negative for headaches.  Psychiatric/Behavioral: The patient is not nervous/anxious.      Physical Exam Updated Vital Signs BP 114/91   Pulse 64   Temp 98.5 F (36.9 C) (Oral)   Resp 16   Ht 5\' 11"  (1.803 m)   Wt (!) 144.4 kg   LMP 03/11/2012   SpO2 99%   BMI 44.40 kg/m   Physical Exam  Constitutional: She appears well-developed and well-nourished. No distress.  HENT:  Head: Normocephalic and atraumatic.  Mouth/Throat: Oropharynx is clear and moist. No oropharyngeal exudate.  Eyes: Conjunctivae are normal. Pupils are equal, round, and reactive to light. Right eye exhibits no discharge. Left eye exhibits no discharge. No scleral icterus.  Neck: Normal range of motion. Neck supple. No thyromegaly present.  Cardiovascular: Normal rate, regular rhythm, normal heart sounds and intact distal pulses.  Exam reveals no  gallop and no friction rub.   No murmur heard. Pulmonary/Chest: Effort normal and breath sounds normal. No stridor. No respiratory distress. She has no wheezes. She has no rales. She exhibits tenderness.    Abdominal: Soft. Bowel sounds are normal. She exhibits no distension. There is no tenderness. There is no rebound and no guarding.  Musculoskeletal: She exhibits no edema.  No calf tenderness to palpation bilaterally  Lymphadenopathy:    She has no cervical adenopathy.  Neurological: She is alert. Coordination normal.  Skin: Skin is warm and dry. No rash noted. She is not diaphoretic. No pallor.  Psychiatric: She has a normal mood and affect.  Nursing note and vitals reviewed.    ED Treatments / Results  Labs (all labs ordered are listed, but only  abnormal results are displayed) Labs Reviewed  CBC WITH DIFFERENTIAL/PLATELET - Abnormal; Notable for the following:       Result Value   Hemoglobin 11.9 (*)    All other components within normal limits  BASIC METABOLIC PANEL - Abnormal; Notable for the following:    Glucose, Bld 113 (*)    All other components within normal limits  URINALYSIS, ROUTINE W REFLEX MICROSCOPIC (NOT AT [facility])  Rosezena Sensor, ED    EKG  EKG Interpretation  Date/Time:  Sunday October 29 2015 21:38:25 EDT Ventricular Rate:  63 PR Interval:    QRS Duration: 97 QT Interval:  440 QTC Calculation: 451 R Axis:   44 Text Interpretation:  Sinus rhythm No STEMI. Compared to prior.  Confirmed by LONG MD, JOSHUA [phone]) on 10/29/2015 9:46:22 PM       Radiology Dg Chest 2 View  Result Date: 10/29/2015 CLINICAL DATA:  Cough, shortness of breath. EXAM: CHEST  2 VIEW COMPARISON:  Radiographs of October 24, 2015. FINDINGS: The heart size and mediastinal contours are within normal limits. Both lungs are clear. No pneumothorax or pleural effusion is noted. The visualized skeletal structures are unremarkable. IMPRESSION: No active cardiopulmonary disease.  Electronically Signed   By: Lupita Raider, M.D.   On: 10/29/2015 21:41    Procedures Procedures (including critical care time)  Medications Ordered in ED Medications  albuterol (PROVENTIL HFA;VENTOLIN HFA) 108 (90 Base) MCG/ACT inhaler 1-2 puff (not administered)  predniSONE (DELTASONE) tablet 60 mg (not administered)     Initial Impression / Assessment and Plan / ED Course  I have reviewed the triage vital signs and the nursing notes.  Pertinent labs & imaging results that were available during my care of the patient were reviewed by me and considered in my medical decision making (see chart for details).  Clinical Course    CBC shows hemoglobin 11.9. BMP unremarkable. Troponin 0.00. EKG shows NSR. UA negative. CXR shows no active cardiopulmonary disease. PERC negative. I suspect residual bronchitis and possible asthma component, as the patient has been diagnosed with asthma. I will discharge patient home with albuterol inhaler, prednisone 5 day burst, Mucinex. Patient to follow-up and establish care with [facility] and I also gave patient information for the [facility] if she receives her mammogram results and needs more rapid follow-up. Strict return precautions discussed. Patient understands and agrees with plan. Patient vitals stable throughout ED course discharged in satisfactory condition. I discussed patient case with Dr. Jacqulyn Bath who guided the patient management and agrees with plan.  Final Clinical Impressions(s) / ED Diagnoses   Final diagnoses:  Upper respiratory tract infection, unspecified type    New Prescriptions New Prescriptions   DEXTROMETHORPHAN-GUAIFENESIN (MUCINEX DM) 30-600 MG 12HR TABLET    Take 1 tablet by mouth 2 (two) times daily as needed for cough.   PREDNISONE (DELTASONE) 20 MG TABLET    Take 1 tablet (20 mg total) by mouth daily.     Emi Holes, PA-C 10/30/15 1914    Maia Plan, MD 10/30/15 [phone]

## 2015-10-29 NOTE — ED Triage Notes (Signed)
Pt seen in ED 10-24-15 dx pneumonia.  Finished antibiotics.  Pt reports cough has improved slightly but still feels short of breath and has wheezing when laying down.  Pt reports still having chills and sweating.  Has not checked temperature,

## 2015-10-29 NOTE — ED Notes (Addendum)
Pt c/o of cough with aching chest pain. Pt reports she was diagnosed with pneumonia and has finished the antibiotics. Pt reports it is hard to lay flat at night, difficulty breathing with phlegm coming up. Pt reports chest pressure and states she just feels bad and weak.

## 2015-10-30 MED ORDER — ALBUTEROL SULFATE HFA 108 (90 BASE) MCG/ACT IN AERS
1.0000 | INHALATION_SPRAY | Freq: Once | RESPIRATORY_TRACT | Status: AC
  Administered 2015-10-30: 2 via RESPIRATORY_TRACT
  Filled 2015-10-30: qty 6.7

## 2015-10-30 MED ORDER — PREDNISONE 20 MG PO TABS: 20.0000 mg | ORAL_TABLET | Freq: Every day | ORAL | 0 refills | Status: DC

## 2015-10-30 MED ORDER — PREDNISONE 20 MG PO TABS
60.0000 mg | ORAL_TABLET | Freq: Once | ORAL | Status: AC
  Administered 2015-10-30: 60 mg via ORAL
  Filled 2015-10-30: qty 3

## 2015-10-30 MED ORDER — DM-GUAIFENESIN ER 30-600 MG PO TB12: 1.0000 | ORAL_TABLET | Freq: Two times a day (BID) | ORAL | 0 refills | Status: DC | PRN

## 2015-10-30 NOTE — Discharge Instructions (Signed)
Medications: Albuterol inhaler, prednisone, Mucinex DM  Treatment: Use albuterol inhaler every 4-6 hours as needed for shortness of breath and/or wheezing. Take prednisone as prescribed for the next 4 days. Take Mucinex twice daily as needed for cough.  Follow-up: Please follow-up and establish care with [facility] and [facility] for follow-up and further evaluation of your symptoms. Please return to the emergency department if you develop any new or worsening symptoms.

## 2016-01-25 ENCOUNTER — Encounter: Payer: Self-pay | Admitting: Physician Assistant

## 2016-01-25 ENCOUNTER — Ambulatory Visit (INDEPENDENT_AMBULATORY_CARE_PROVIDER_SITE_OTHER): Payer: Worker's Compensation | Admitting: Physician Assistant

## 2016-01-25 VITALS — BP 152/84 | HR 78 | Temp 98.0°F | Ht 71.0 in | Wt 318.2 lb

## 2016-01-25 DIAGNOSIS — L299 Pruritus, unspecified: Secondary | ICD-10-CM | POA: Diagnosis not present

## 2016-01-25 DIAGNOSIS — B86 Scabies: Secondary | ICD-10-CM

## 2016-01-25 MED ORDER — HYDROXYZINE HCL 25 MG PO TABS: 25.0000 mg | ORAL_TABLET | Freq: Three times a day (TID) | ORAL | 0 refills | Status: DC | PRN

## 2016-01-25 MED ORDER — PERMETHRIN 5 % EX CREA: 1.0000 "application " | TOPICAL_CREAM | Freq: Once | CUTANEOUS | 0 refills | Status: AC

## 2016-01-25 NOTE — Progress Notes (Addendum)
Kearney Harderesa L Samaras 08-16-1970 45 y.o.   Chief Complaint  Patient presents with  . Ankles itching    X 8 day ankles ang leg itching    Date of Injury: 01/17/2016  History of Present Illness:  Presents for evaluation of work-related complaint. On 01/17/2016 she felt like she had "bumps like a mosquito bite" on her left calf. "very itchy". Applied Neosporin and alcohol, helped. She felt fine.    There was a "confirmed case of scabies" at her work and closed the office "until further notice" - from 12/20 - 12/28. Today was her first day back at work. While at work her ankles started "itching to the bone".  She endorses a rash on her ankles, similar to the rash she had on 12/20. Several people are out for the same problem. Denies nausea, vomiting, abdominal pain, chest pain, SOB, wheezing.   Review of Systems  Constitutional: Negative for chills and fever.  Respiratory: Negative for cough, shortness of breath and wheezing.   Cardiovascular: Negative for chest pain, palpitations, claudication and leg swelling.  Gastrointestinal: Negative for abdominal pain, diarrhea and nausea.  Musculoskeletal: Negative for back pain and joint pain.  Skin: Positive for itching and rash.  Neurological: Negative for weakness.    Allergies  Allergen Reactions  . Dilaudid [Hydromorphone Hcl] Itching and Rash    Itching and rash Pt reports taking dilaudid at home Dr. Estanislado Pandyivard aware of allergy.  . Hydrocodone Nausea And Vomiting    Patient will not take this  . Ultram [Tramadol] Itching and Nausea And Vomiting  . Codeine Itching and Rash    Current medications reviewed and updated. Past medical history, family history, social history have been reviewed and updated.   Physical Exam  Constitutional: She is oriented to person, place, and time and well-developed, well-nourished, and in no distress. No distress.  Cardiovascular: Normal rate, regular rhythm and normal heart sounds.   Neurological: She is  alert and oriented to person, place, and time. GCS score is 15.  Skin: Skin is warm and dry. Rash noted. Rash is papular (erythematous, excoriated papules b/l ankles and left calf. ) and urticarial (Small Wheals left calf and b/l ankles).  Psychiatric: Mood, memory, affect and judgment normal.  Vitals reviewed.   Assessment and Plan: It is my medical opinion that this patient contracted this condition while at work.  1. Scabies 2. Pruritus - permethrin (ELIMITE) 5 % cream; Apply 1 application topically once.  Dispense: 60 g; Refill: 0 - hydrOXYzine (ATARAX/VISTARIL) 25 MG tablet; Take 1 tablet (25 mg total) by mouth 3 (three) times daily as needed.  Dispense: 30 tablet; Refill: 0 - Discussed with patient and printed out detailed instructions for scabies treatment including cream application, washing of clothes and bedding, treatment of others in her household. She understands and agrees. Follow-up in three days to assure eradication. Will RTC if symptoms persist/return.    Marco CollieWhitney Hayze Gazda, PA-C  [facility] 01/25/2016 6:04 PM

## 2016-01-25 NOTE — Patient Instructions (Addendum)
Apply Permethrin cream from neck down to soles of feet, wash off after 8-14; may need to repeat treatment after 10-14 days if evidence of live mites. Be sure to apply to all areas, including under fingernails, between fingers/toes, between fingers/toes, in the groin.  Close personal contacts should also be treated at the same time.  Recently worn clothing and bedsheets should be washed at 140 degrees F or higher and dried the day after the first treatment to decrease chance of reinfestation. See below for more tips.   Hydroxyzine is for itching.    Scabies, Adult Introduction Scabies is a skin condition that happens when very small insects get under the skin (infestation). This causes a rash and severe itchiness. Scabies can spread from person to person (is contagious). If you get scabies, it is common for others in your household to get scabies too. With proper treatment, symptoms usually go away in 2-4 weeks. Scabies usually does not cause lasting problems. What are the causes? This condition is caused by mites (Sarcoptes scabiei, or human itch mites) that can only be seen with a microscope. The mites get into the top layer of skin and lay eggs. Scabies can spread from person to person through:  Close contact with a person who has scabies.  Contact with infested items, such as towels, bedding, or clothing. What increases the risk? This condition is more likely to develop in:  People who live in nursing homes and other extended-care facilities.  People who have sexual contact with a partner who has scabies.  Young children who attend child care facilities.  People who care for others who are at increased risk for scabies. What are the signs or symptoms? Symptoms of this condition may include:  Severe itchiness. This is often worse at night.  A rash that includes tiny red bumps or blisters. The rash commonly occurs on the wrist, elbow, armpit, fingers, waist, groin, or buttocks. Bumps  may form a line (burrow) in some areas.  Skin irritation. This can include scaly patches or sores. How is this diagnosed? This condition is diagnosed with a physical exam. Your health care provider will look closely at your skin. In some cases, your health care provider may take a sample of your affected skin (skin scraping) and have it examined under a microscope. How is this treated? This condition may be treated with:  Medicated cream or lotion that kills the mites. This is spread on the entire body and left on for several hours. Usually, one treatment with medicated cream or lotion is enough to kill all of the mites. In severe cases, the treatment may be repeated.  Medicated cream that relieves itching.  Medicines that help to relieve itching.  Medicines that kill the mites. This treatment is rarely used. Follow these instructions at home:   Medicines  Take or apply over-the-counter and prescription medicines as told by your health care provider.  Apply medicated cream or lotion as told by your health care provider.  Do not wash off the medicated cream or lotion until the necessary amount of time has passed. Skin Care  Avoid scratching your affected skin.  Keep your fingernails closely trimmed to reduce injury from scratching.  Take cool baths or apply cool washcloths to help reduce itching. General instructions  Clean all items that you recently had contact with, including bedding, clothing, and furniture. Do this on the same day that your treatment starts.  Use hot water when you wash items.  Place [place]  items into closed, airtight plastic bags for at least 3 days. The mites cannot live for more than 3 days away from human skin.  Vacuum furniture and mattresses that you use.  Make sure that other people who may have been infested are examined by a health care provider. These include members of your household and anyone who may have had contact with infested  items.  Keep all follow-up visits as told by your health care provider. This is important. Contact a health care provider if:  You have itching that does not go away after 4 weeks of treatment.  You continue to develop new bumps or burrows.  You have redness, swelling, or pain in your rash area after treatment.  You have fluid, blood, or pus coming from your rash. This information is not intended to replace advice given to you by your health care provider. Make sure you discuss any questions you have with your health care provider. Document Released: 10/05/2014 Document Revised: 06/22/2015 Document Reviewed: 08/16/2014  2017 Elsevier  IF you received an x-ray today, you will receive an invoice from [facility]. Please contact [facility] at [phone] with questions or concerns regarding your invoice.   IF you received labwork today, you will receive an invoice from [facility]. Please contact [facility] at [phone] with questions or concerns regarding your invoice.   Our billing staff will not be able to assist you with questions regarding bills from these companies.  You will be contacted with the lab results as soon as they are available. The fastest way to get your results is to activate your My Chart account. Instructions are located on the last page of this paperwork. If you have not heard from us regarding the results in 2 weeks, please contact this office.

## 2016-07-08 ENCOUNTER — Emergency Department (HOSPITAL_COMMUNITY)
Admission: EM | Admit: 2016-07-08 | Discharge: 2016-07-08 | Disposition: A | Discharge status: Home / Self Care | Payer: Self-pay | Attending: Emergency Medicine | Admitting: Emergency Medicine

## 2016-07-08 ENCOUNTER — Encounter (HOSPITAL_COMMUNITY): Payer: Self-pay | Admitting: *Deleted

## 2016-07-08 ENCOUNTER — Emergency Department (HOSPITAL_COMMUNITY): Payer: Self-pay

## 2016-07-08 DIAGNOSIS — I10 Essential (primary) hypertension: Secondary | ICD-10-CM | POA: Insufficient documentation

## 2016-07-08 DIAGNOSIS — Z79899 Other long term (current) drug therapy: Secondary | ICD-10-CM | POA: Insufficient documentation

## 2016-07-08 DIAGNOSIS — R519 Headache, unspecified: Secondary | ICD-10-CM

## 2016-07-08 DIAGNOSIS — R51 Headache: Secondary | ICD-10-CM | POA: Insufficient documentation

## 2016-07-08 HISTORY — DX: Disorder of kidney and ureter, unspecified: N28.9

## 2016-07-08 LAB — COMPREHENSIVE METABOLIC PANEL
ALBUMIN: 3.3 g/dL — AB (ref 3.5–5.0)
ALK PHOS: 85 U/L (ref 38–126)
ALT: 17 U/L (ref 14–54)
ANION GAP: 7 (ref 5–15)
AST: 19 U/L (ref 15–41)
BILIRUBIN TOTAL: 0.5 mg/dL (ref 0.3–1.2)
BUN: 8 mg/dL (ref 6–20)
CALCIUM: 8.9 mg/dL (ref 8.9–10.3)
CO2: 26 mmol/L (ref 22–32)
CREATININE: 0.92 mg/dL (ref 0.44–1.00)
Chloride: 103 mmol/L (ref 101–111)
GFR calc Af Amer: >60 mL/min (ref >60)
GFR calc non Af Amer: >60 mL/min (ref >60)
GLUCOSE: 102 mg/dL — AB (ref 65–99)
Potassium: 3.4 mmol/L — ABNORMAL LOW (ref 3.5–5.1)
SODIUM: 136 mmol/L (ref 135–145)
TOTAL PROTEIN: 6.7 g/dL (ref 6.5–8.1)

## 2016-07-08 LAB — CBC WITH DIFFERENTIAL/PLATELET
BASOS ABS: 0 10*3/uL (ref 0.0–0.1)
BASOS PCT: 0 %
EOS ABS: 0.1 10*3/uL (ref 0.0–0.7)
EOS PCT: 1 %
HCT: 35.9 % — ABNORMAL LOW (ref 36.0–46.0)
Hemoglobin: 11.8 g/dL — ABNORMAL LOW (ref 12.0–15.0)
Lymphocytes Relative: 35 %
Lymphs Abs: 2.4 10*3/uL (ref 0.7–4.0)
MCH: 28.4 pg (ref 26.0–34.0)
MCHC: 32.9 g/dL (ref 30.0–36.0)
MCV: 86.3 fL (ref 78.0–100.0)
Monocytes Absolute: 0.3 10*3/uL (ref 0.1–1.0)
Monocytes Relative: 4 %
Neutro Abs: 4.1 10*3/uL (ref 1.7–7.7)
Neutrophils Relative %: 60 %
PLATELETS: 227 10*3/uL (ref 150–400)
RBC: 4.16 MIL/uL (ref 3.87–5.11)
RDW: 13 % (ref 11.5–15.5)
WBC: 6.9 10*3/uL (ref 4.0–10.5)

## 2016-07-08 LAB — I-STAT CG4 LACTIC ACID, ED: Lactic Acid, Venous: 0.78 mmol/L (ref 0.5–1.9)

## 2016-07-08 MED ORDER — KETOROLAC TROMETHAMINE 60 MG/2ML IM SOLN
60.0000 mg | Freq: Once | INTRAMUSCULAR | Status: AC
  Administered 2016-07-08: 60 mg via INTRAMUSCULAR
  Filled 2016-07-08: qty 2

## 2016-07-08 MED ORDER — LISINOPRIL 10 MG PO TABS: 10.0000 mg | ORAL_TABLET | Freq: Every day | ORAL | 3 refills | Status: DC

## 2016-07-08 NOTE — ED Triage Notes (Signed)
c /o generalized body aches states she was around a co-worker last week and she was dx. With flu. And pneumonia

## 2016-07-08 NOTE — Discharge Instructions (Signed)
° °  [facility] this [facility]. Establish primary care doctor.   Take the lisinopril as directed for high blood pressure.  Return to the emergency department for any chest pain, difficulty breathing, headache, vision changes, nausea and vomiting or any other worsening or concerning symptoms.    If you do not have a primary care doctor you see regularly, please you the list below. Please call them to arrange for follow-up.    No Primary Care Doctor Call [facility]  [phone] Other agencies that provide inexpensive medical care    [facility]  [phone]    [facility]  [phone]    [facility]  [phone]    [facility]  [phone]    [facility]  [phone]    [facility]  [phone]

## 2016-07-08 NOTE — ED Provider Notes (Signed)
[facility]-EMERGENCY DEPT Provider Note   CSN: [id number] Arrival date & time: 07/08/16  1030     History   Chief Complaint Chief Complaint  Patient presents with  . Headache  . Influenza    HPI Destiny Delgado is a 46 y.o. female presents with multiple complaints of an ongoing for one week. Patient states that she has had an intermittent headache for the past week. Patient states that she's been taking ibuprofen with temporary relief of headache. She states that headache originates in the frontal region and radiates posteriorly. Currently on ED arrival, she states it is an 8 out of 10. Patient states that headache is worsened with movement and activity and is improved with rest. Patient reports some intermittent blurry vision but states that this is been ongoing for the past couple months and is not new. She denies any current vision changes. She has not been evaluated yet. Patient reports that for the past week she has felt generalized fatigue, myalgias, congestion. Patient states that she had one episode of mild chest pain 4 days ago when she was having generalized myalgias and coughing but none since. She described it as an ache. She states she had one episode of fever where her temperature was 103.1. She states that that has since resolved. She also had a few episodes where she felt like she was wheezing. Patient states she has a history of asthma and hypertension. She was previously prescription prescribed her albuterol and Advair inhalers for treatment of asthma. Patient states she is no longer taking this. Patient was previously prescribed lisinopril for her blood pressure but states that she has not taken it in several months. Patient recently moved to [place] area from [place] and has not been able to establish a primary care doctor. Patient reports that last week her coworker came to work with a cough and cold symptoms and was later diagnosed with pneumonia. Patient denies any  numbness/weakness of arms or legs, speech difficulty, dysuria, hematuria, abdominal pain, vomiting.  The history is provided by the patient.    Past Medical History:  Diagnosis Date  . Anemia   . Complication of anesthesia    difficult to awaken  . Head ache   . History of blood transfusion 01/2012   at [facility]  . History of chicken pox   . Hypertension   . Obesity   . Renal disorder   . SVD (spontaneous vaginal delivery)    x 4    Patient Active Problem List   Diagnosis Date Noted  . Hypertension 01/15/2012  . Head ache   . History of chicken pox     Past Surgical History:  Procedure Laterality Date  . ABDOMINAL HYSTERECTOMY    . BILATERAL SALPINGECTOMY Bilateral 03/19/2012   Procedure: BILATERAL SALPINGECTOMY;  Surgeon: Esmeralda Arthur, MD;  Location: [facility];  Service: Gynecology;  Laterality: Bilateral;  robotic assisted   . CHOLECYSTECTOMY    . CYSTOSCOPY W/ RETROGRADES Bilateral 03/19/2012   Procedure: CYSTOSCOPY WITH RETROGRADE PYELOGRAM;  Surgeon: Kathi Ludwig, MD;  Location: [facility];  Service: Urology;  Laterality: Bilateral;  with lighted bil stents  . DILITATION & CURRETTAGE/HYSTROSCOPY WITH THERMACHOICE ABLATION  02/26/2012   Procedure: DILATATION & CURETTAGE/HYSTEROSCOPY WITH THERMACHOICE ABLATION;  Surgeon: Michael Litter, MD;  Location: [facility];  Service: Gynecology;  Laterality: N/A;  . HYSTEROSCOPY    . ROBOTIC ASSISTED TOTAL HYSTERECTOMY N/A 03/19/2012   Procedure: ROBOTIC ASSISTED TOTAL HYSTERECTOMY;  Surgeon: Esmeralda Arthur, MD;  Location: [facility];  Service: Gynecology;  Laterality: N/A;  . TUBAL LIGATION      OB History    Gravida Para Term Preterm AB Living   4 4       4    SAB TAB Ectopic Multiple Live Births                   Home Medications    Prior to Admission medications   Medication Sig Start Date End Date Taking? Authorizing Provider  hydrOXYzine (ATARAX/VISTARIL) 25 MG tablet Take 1 tablet (25 mg total) by mouth 3 (three)  times daily as needed. 01/25/16   McVey, Madelaine Bhat, PA-C  lisinopril (PRINIVIL,ZESTRIL) 10 MG tablet Take 1 tablet (10 mg total) by mouth daily. 07/08/16   Maxwell Caul, PA-C    Family History Family History  Problem Relation Age of Onset  . Stroke Father   . Hypertension Father   . Diabetes Father   . Heart disease Mother   . Breast cancer Mother   . Thyroid disease Mother   . Anemia Mother   . Diabetes Mother   . Diabetes Brother   . Asthma Daughter   . Diabetes Paternal Aunt   . Diabetes Maternal Aunt   . Diabetes Maternal Uncle   . Diabetes Paternal Uncle     Social History Social History  Substance Use Topics  . Smoking status: Never Smoker  . Smokeless tobacco: Never Used  . Alcohol use No     Allergies   Dilaudid [hydromorphone hcl]; Hydrocodone; Ultram [tramadol]; and Codeine   Review of Systems Review of Systems  Constitutional: Negative for chills and fever.  HENT: Positive for congestion. Negative for rhinorrhea and sore throat.   Eyes: Negative for visual disturbance (intermittent, chronic).  Respiratory: Negative for cough and shortness of breath.   Cardiovascular: Negative for chest pain.  Gastrointestinal: Negative for abdominal pain, diarrhea, nausea and vomiting.  Genitourinary: Negative for dysuria and hematuria.  Musculoskeletal: Positive for myalgias. Negative for back pain and neck pain.  Neurological: Positive for headaches. Negative for dizziness, weakness and numbness.  All other systems reviewed and are negative.    Physical Exam Updated Vital Signs BP (!) 154/91 (BP Location: Left Arm)   Pulse (!) 59   Temp 98.6 F (37 C) (Oral)   Resp 16   LMP 03/11/2012   SpO2 100%   Physical Exam  Constitutional: She is oriented to person, place, and time. She appears well-developed and well-nourished.  Sitting comfortably on examination table  HENT:  Head: Normocephalic and atraumatic.  Mouth/Throat: Oropharynx is clear and  moist and mucous membranes are normal.  No deformities or crepitus noted. Mild frontal tenderness palpation.  Eyes: Conjunctivae, EOM and lids are normal. Pupils are equal, round, and reactive to light.  Fundoscopic exam:      The right eye shows no papilledema.       The left eye shows no papilledema.  Neck: Full passive range of motion without pain.  Cardiovascular: Normal rate, regular rhythm, normal heart sounds and normal pulses.  Exam reveals no gallop and no friction rub.   No murmur heard. 1+ pitting edema to bilateral ankles. Edema is symmetric. No overlying warmth, tenderness or erythema.  Pulmonary/Chest: Effort normal and breath sounds normal. She has no wheezes. She has no rales.  No evidence of respiratory distress. Able to speak in full sentences without difficulty.  Abdominal: Soft. Normal appearance. There is no tenderness. There is no rigidity and no  guarding.  Musculoskeletal: Normal range of motion.  Neurological: She is alert and oriented to person, place, and time.  Cranial nerves III-XII intact Follows commands, Moves all extremities  5/5 strength to BUE and BLE  Sensation intact throughout  Normal finger to nose. No dysdiadochokinesia. No pronator drift. No gait abnormalities  No slurred speech. No facial droop.   Skin: Skin is warm and dry. Capillary refill takes less than 2 seconds.  Psychiatric: She has a normal mood and affect. Her speech is normal.  Nursing note and vitals reviewed.    ED Treatments / Results  Labs (all labs ordered are listed, but only abnormal results are displayed) Labs Reviewed  COMPREHENSIVE METABOLIC PANEL - Abnormal; Notable for the following:       Result Value   Potassium 3.4 (*)    Glucose, Bld 102 (*)    Albumin 3.3 (*)    All other components within normal limits  CBC WITH DIFFERENTIAL/PLATELET - Abnormal; Notable for the following:    Hemoglobin 11.8 (*)    HCT 35.9 (*)    All other components within normal limits    I-STAT CG4 LACTIC ACID, ED    EKG  EKG Interpretation  Date/Time:  Monday July 08 2016 11:14:46 EDT Ventricular Rate:  59 PR Interval:  162 QRS Duration: 92 QT Interval:  438 QTC Calculation: 433 R Axis:   59 Text Interpretation:  Sinus bradycardia Otherwise normal ECG Confirmed by Jacalyn LefevreHaviland, Julie [phone]([id number]) on 07/08/2016 1:32:50 PM       Radiology Dg Chest 2 View  Result Date: 07/08/2016 CLINICAL DATA:  Dry cough, flu like symptoms x1 week EXAM: CHEST  2 VIEW COMPARISON:  None. FINDINGS: Lungs are clear.  No pleural effusion or pneumothorax. The heart is normal in size. Visualized osseous structures are within normal limits. IMPRESSION: Normal chest radiographs. Electronically Signed   By: Charline BillsSriyesh  Krishnan M.D.   On: 07/08/2016 11:50    Procedures Procedures (including critical care time)  Medications Ordered in ED Medications  ketorolac (TORADOL) injection 60 mg (60 mg Intramuscular Given 07/08/16 1449)     Initial Impression / Assessment and Plan / ED Course  I have reviewed the triage vital signs and the nursing notes.  Pertinent labs & imaging results that were available during my care of the patient were reviewed by me and considered in my medical decision making (see chart for details).     46 year old female past medical history is asthma, anemia, hypertension presents with multiple complaints x  one week. Patient is afebrile, non-toxic appearing, sitting comfortably on examination table. Vitals reviewed. O2 sats >95% on RA. Patient is hypertensive in the emergency department. Likely secondary to the fact the patient has been noncompliant with her medications. No tachycardia. Patient primarily in the emergency department today for evaluation of headache which has been intermittent for the past week.  Patient states she is also concerned about pneumonia since her coworker recently came to work while sick and was diagnosed with pneumonia. No neuro deficits on physical  exam. History/physical exam are not concerning for PE. Consider hypertension secondary to medication noncompliance vs acute infectious etiology versus generalized headache. Labs and imaging ordered at triage including CBC, BMP, UA, EKG, chest x-ray. Patient given analgesics in the department.   EKG reviewed. Normal sinus rhythm rate 58. No acute abnormalities. CBC shows mild anemia but patient has a known history of anemia. The patient is slightly elevated blood glucose and slightly low Potassium. Rest and acute abnormalities. Initial  lactic acid ordered triage is negative. Chest x-ray is negative for any acute infectious etiology.  Discussed results with patient. Patient reports improvement in headache after Toradol. She states that it is currently on a 5/10.  Will obtain visual acuity.    Visual Acuity  Right Eye Near: R Near: 20/15 Left Eye Near:  L Near: 20/15 Bilateral Near:  20/15   Reevaluation. Patient reports that headache has significantly improved after Toradol use. She states that she feels much better after medication. The patient is stable for discharge. Patient's hypertension is likely secondary to medication noncompliance. Will plan to refill her lisinopril so that she can begin taking it again. Provided patient with a list of clinic resources to use if he does not have a PCP. Instructed to call them today to arrange follow-up in the next 24-48 hours. Return precautions discussed. Patient expresses understanding and agreement to plan.     Final Clinical Impressions(s) / ED Diagnoses   Final diagnoses:  Acute nonintractable headache, unspecified headache type  Hypertension, unspecified type    New Prescriptions Discharge Medication List as of 07/08/2016  3:37 PM       Maxwell Caul, PA-C 07/08/16 2053    Jacalyn Lefevre, MD 07/09/16 0740

## 2016-11-29 ENCOUNTER — Emergency Department (HOSPITAL_COMMUNITY): Payer: Self-pay

## 2016-11-29 ENCOUNTER — Emergency Department (HOSPITAL_COMMUNITY)
Admission: EM | Admit: 2016-11-29 | Discharge: 2016-11-29 | Disposition: A | Discharge status: Home / Self Care | Payer: Self-pay | Attending: Emergency Medicine | Admitting: Emergency Medicine

## 2016-11-29 ENCOUNTER — Encounter (HOSPITAL_COMMUNITY): Payer: Self-pay | Admitting: Emergency Medicine

## 2016-11-29 DIAGNOSIS — R29898 Other symptoms and signs involving the musculoskeletal system: Secondary | ICD-10-CM

## 2016-11-29 DIAGNOSIS — R51 Headache: Secondary | ICD-10-CM | POA: Insufficient documentation

## 2016-11-29 DIAGNOSIS — J45909 Unspecified asthma, uncomplicated: Secondary | ICD-10-CM | POA: Insufficient documentation

## 2016-11-29 DIAGNOSIS — R531 Weakness: Secondary | ICD-10-CM | POA: Insufficient documentation

## 2016-11-29 DIAGNOSIS — M79602 Pain in left arm: Secondary | ICD-10-CM | POA: Insufficient documentation

## 2016-11-29 DIAGNOSIS — I10 Essential (primary) hypertension: Secondary | ICD-10-CM | POA: Insufficient documentation

## 2016-11-29 HISTORY — DX: Unspecified asthma, uncomplicated: J45.909

## 2016-11-29 LAB — CBC WITH DIFFERENTIAL/PLATELET
Basophils Absolute: 0 10*3/uL (ref 0.0–0.1)
Basophils Relative: 0 %
EOS ABS: 0.1 10*3/uL (ref 0.0–0.7)
Eosinophils Relative: 2 %
HCT: 37.8 % (ref 36.0–46.0)
Hemoglobin: 12.7 g/dL (ref 12.0–15.0)
LYMPHS ABS: 2.3 10*3/uL (ref 0.7–4.0)
Lymphocytes Relative: 30 %
MCH: 29.3 pg (ref 26.0–34.0)
MCHC: 33.6 g/dL (ref 30.0–36.0)
MCV: 87.1 fL (ref 78.0–100.0)
MONOS PCT: 6 %
Monocytes Absolute: 0.4 10*3/uL (ref 0.1–1.0)
NEUTROS ABS: 4.7 10*3/uL (ref 1.7–7.7)
Neutrophils Relative %: 62 %
PLATELETS: 240 10*3/uL (ref 150–400)
RBC: 4.34 MIL/uL (ref 3.87–5.11)
RDW: 12.8 % (ref 11.5–15.5)
WBC: 7.5 10*3/uL (ref 4.0–10.5)

## 2016-11-29 LAB — BASIC METABOLIC PANEL
Anion gap: 7 (ref 5–15)
BUN: 11 mg/dL (ref 6–20)
CHLORIDE: 102 mmol/L (ref 101–111)
CO2: 28 mmol/L (ref 22–32)
CREATININE: 0.81 mg/dL (ref 0.44–1.00)
Calcium: 9.2 mg/dL (ref 8.9–10.3)
GFR calc Af Amer: >60 mL/min (ref >60)
GFR calc non Af Amer: >60 mL/min (ref >60)
Glucose, Bld: 110 mg/dL — ABNORMAL HIGH (ref 65–99)
Potassium: 3.8 mmol/L (ref 3.5–5.1)
SODIUM: 137 mmol/L (ref 135–145)

## 2016-11-29 LAB — TROPONIN I: Troponin I: <0.03 ng/mL (ref <0.03)

## 2016-11-29 MED ORDER — KETOROLAC TROMETHAMINE 30 MG/ML IJ SOLN
30.0000 mg | Freq: Once | INTRAMUSCULAR | Status: AC
  Administered 2016-11-29: 30 mg via INTRAVENOUS
  Filled 2016-11-29: qty 1

## 2016-11-29 MED ORDER — KETOROLAC TROMETHAMINE 30 MG/ML IJ SOLN
15.0000 mg | Freq: Once | INTRAMUSCULAR | Status: AC
  Administered 2016-11-29: 15 mg via INTRAVENOUS
  Filled 2016-11-29: qty 1

## 2016-11-29 MED ORDER — LORAZEPAM 2 MG/ML IJ SOLN
1.0000 mg | Freq: Once | INTRAMUSCULAR | Status: AC | PRN
  Administered 2016-11-29: 1 mg via INTRAVENOUS
  Filled 2016-11-29: qty 1

## 2016-11-29 NOTE — ED Notes (Signed)
Patient transported to MRI 

## 2016-11-29 NOTE — ED Provider Notes (Signed)
Emergency Department Provider Note   I have reviewed the triage vital signs and the nursing notes.   HISTORY  Chief Complaint Weakness   HPI Destiny Delgado is a 46 y.o. female with PMH of HTN and obesity presents to the emergency department for evaluation of left arm pain with associated weakness off balance for the past 4-5 days.  Patient denies any significant leg or face symptoms.  She has pain from the shoulder through the entire left arm.  She states that she has been dropping things that she holds in her left hand and states that the arm feels heavy and slightly numb.  No similar problems in the past.  No injury to the arm or neck.  No vision or speech changes.  No history of prior stroke.    Past Medical History:  Diagnosis Date  . Anemia   . Asthma   . Complication of anesthesia    difficult to awaken  . Head ache   . History of blood transfusion 01/2012   at [facility]  . History of chicken pox   . Hypertension   . Obesity   . Renal disorder   . SVD (spontaneous vaginal delivery)    x 4    Patient Active Problem List   Diagnosis Date Noted  . Hypertension 01/15/2012  . Head ache   . History of chicken pox     Past Surgical History:  Procedure Laterality Date  . ABDOMINAL HYSTERECTOMY    . BILATERAL SALPINGECTOMY Bilateral 03/19/2012   Procedure: BILATERAL SALPINGECTOMY;  Surgeon: Esmeralda ArthurSandra A Rivard, MD;  Location: [facility];  Service: Gynecology;  Laterality: Bilateral;  robotic assisted   . CHOLECYSTECTOMY    . CYSTOSCOPY W/ RETROGRADES Bilateral 03/19/2012   Procedure: CYSTOSCOPY WITH RETROGRADE PYELOGRAM;  Surgeon: Kathi LudwigSigmund I Tannenbaum, MD;  Location: [facility];  Service: Urology;  Laterality: Bilateral;  with lighted bil stents  . DILITATION & CURRETTAGE/HYSTROSCOPY WITH THERMACHOICE ABLATION  02/26/2012   Procedure: DILATATION & CURETTAGE/HYSTEROSCOPY WITH THERMACHOICE ABLATION;  Surgeon: Michael LitterNaima A Dillard, MD;  Location: [facility];  Service: Gynecology;   Laterality: N/A;  . HYSTEROSCOPY    . ROBOTIC ASSISTED TOTAL HYSTERECTOMY N/A 03/19/2012   Procedure: ROBOTIC ASSISTED TOTAL HYSTERECTOMY;  Surgeon: Esmeralda ArthurSandra A Rivard, MD;  Location: [facility];  Service: Gynecology;  Laterality: N/A;  . TUBAL LIGATION      Current Outpatient Rx  . Order #: [id number] Class: Print  . Order #: [id number] Class: Historical Med    Allergies Dilaudid [hydromorphone hcl]; Hydrocodone; Ultram [tramadol]; and Codeine  Family History  Problem Relation Age of Onset  . Stroke Father   . Hypertension Father   . Diabetes Father   . Heart disease Mother   . Breast cancer Mother   . Thyroid disease Mother   . Anemia Mother   . Diabetes Mother   . Diabetes Brother   . Asthma Daughter   . Diabetes Paternal Aunt   . Diabetes Maternal Aunt   . Diabetes Maternal Uncle   . Diabetes Paternal Uncle     Social History Social History  Substance Use Topics  . Smoking status: Never Smoker  . Smokeless tobacco: Never Used  . Alcohol use No    Review of Systems  Constitutional: No fever/chills Eyes: No visual changes. ENT: No sore throat. Cardiovascular: Denies chest pain. Respiratory: Denies shortness of breath. Gastrointestinal: No abdominal pain.  No nausea, no vomiting.  No diarrhea.  No constipation. Genitourinary: Negative for dysuria. Musculoskeletal: Negative for  back pain. Skin: Negative for rash. Neurological: Negative for headaches. Positive left arm weakness, numbness, and pain.   10-point ROS otherwise negative.  ____________________________________________   PHYSICAL EXAM:  VITAL SIGNS: ED Triage Vitals  Enc Vitals Group     BP 11/29/16 0811 (!) 171/111     Pulse Rate 11/29/16 0811 62     Resp 11/29/16 0811 18     Temp 11/29/16 0811 98.3 F (36.8 C)     Temp src --      SpO2 11/29/16 0811 96 %     Weight 11/29/16 0809 300 lb (136.1 kg)     Height 11/29/16 0809 5\' 11"  (1.803 m)     Pain Score 11/29/16 0809 9    Constitutional: Alert  and oriented. Well appearing and in no acute distress. Eyes: Conjunctivae are normal. PERRL. EOMI. Head: Atraumatic. Nose: No congestion/rhinnorhea. Mouth/Throat: Mucous membranes are moist.   Neck: No stridor.  Cardiovascular: Normal rate, regular rhythm. Good peripheral circulation. Grossly normal heart sounds.   Respiratory: Normal respiratory effort.  No retractions. Lungs CTAB. Gastrointestinal: Soft and nontender. No distention.  Musculoskeletal: No lower extremity tenderness nor edema. No gross deformities of extremities. Neurologic:  Normal speech and language. 4+/5 strength in the left biceps, triceps, and grip. Decreased sensation to light touch in the left arm. Normal strength and sensation in remaining extremities. Normal CN exam 2-12.  Skin:  Skin is warm, dry and intact. No rash noted.  ____________________________________________   LABS (all labs ordered are listed, but only abnormal results are displayed)  Labs Reviewed  BASIC METABOLIC PANEL - Abnormal; Notable for the following:       Result Value   Glucose, Bld 110 (*)    All other components within normal limits  CBC WITH DIFFERENTIAL/PLATELET  TROPONIN I   ____________________________________________  EKG   EKG Interpretation  Date/Time:  Friday November 29 2016 08:17:06 EDT Ventricular Rate:  60 PR Interval:    QRS Duration: 96 QT Interval:  432 QTC Calculation: 432 R Axis:   34 Text Interpretation:  Sinus rhythm No STEMI.  Confirmed by Alona Bene [phone]) on 11/29/2016 8:37:49 AM       ____________________________________________  RADIOLOGY  Dg Chest 2 View  Result Date: 11/29/2016 CLINICAL DATA:  46 year old female with left-sided chest pain and arm weakness for 1 week. EXAM: CHEST  2 VIEW COMPARISON:  07/08/2016 FINDINGS: The heart size and mediastinal contours are within normal limits. Both lungs are clear. The visualized skeletal structures are unremarkable. IMPRESSION: No active  cardiopulmonary disease. Electronically Signed   By: Sande Brothers M.D.   On: 11/29/2016 09:20   Ct Head Wo Contrast  Result Date: 11/29/2016 CLINICAL DATA:  Left-sided weakness for several days, getting worse. No known injury. EXAM: CT HEAD WITHOUT CONTRAST CT CERVICAL SPINE WITHOUT CONTRAST TECHNIQUE: Multidetector CT imaging of the head and cervical spine was performed following the standard protocol without intravenous contrast. Multiplanar CT image reconstructions of the cervical spine were also generated. COMPARISON:  09/29/2003 FINDINGS: CT HEAD FINDINGS Brain: No acute intracranial abnormality. Specifically, no hemorrhage, hydrocephalus, mass lesion, acute infarction, or significant intracranial injury. Vascular: No hyperdense vessel or unexpected calcification. Skull: No acute calvarial abnormality. Sinuses/Orbits: Visualized paranasal sinuses and mastoids clear. Orbital soft tissues unremarkable. Other: None CT CERVICAL SPINE FINDINGS Alignment: Normal alignment. Skull base and vertebrae: No fracture Soft tissues and spinal canal: Prevertebral soft tissues are normal. No epidural or paraspinal hematoma. Disc levels:  Maintained.  No neural foraminal narrowing. Upper  chest: Negative Other: None IMPRESSION: No intracranial abnormality. No bony abnormality in the cervical spine. Electronically Signed   By: Charlett Nose M.D.   On: 11/29/2016 10:48   Ct Cervical Spine Wo Contrast  Result Date: 11/29/2016 CLINICAL DATA:  Left-sided weakness for several days, getting worse. No known injury. EXAM: CT HEAD WITHOUT CONTRAST CT CERVICAL SPINE WITHOUT CONTRAST TECHNIQUE: Multidetector CT imaging of the head and cervical spine was performed following the standard protocol without intravenous contrast. Multiplanar CT image reconstructions of the cervical spine were also generated. COMPARISON:  09/29/2003 FINDINGS: CT HEAD FINDINGS Brain: No acute intracranial abnormality. Specifically, no hemorrhage,  hydrocephalus, mass lesion, acute infarction, or significant intracranial injury. Vascular: No hyperdense vessel or unexpected calcification. Skull: No acute calvarial abnormality. Sinuses/Orbits: Visualized paranasal sinuses and mastoids clear. Orbital soft tissues unremarkable. Other: None CT CERVICAL SPINE FINDINGS Alignment: Normal alignment. Skull base and vertebrae: No fracture Soft tissues and spinal canal: Prevertebral soft tissues are normal. No epidural or paraspinal hematoma. Disc levels:  Maintained.  No neural foraminal narrowing. Upper chest: Negative Other: None IMPRESSION: No intracranial abnormality. No bony abnormality in the cervical spine. Electronically Signed   By: Charlett Nose M.D.   On: 11/29/2016 10:48    ____________________________________________   PROCEDURES  Procedure(s) performed:   Procedures  None ____________________________________________   INITIAL IMPRESSION / ASSESSMENT AND PLAN / ED COURSE  Pertinent labs & imaging results that were available during my care of the patient were reviewed by me and considered in my medical decision making (see chart for details).  Patient presents to the emergency department for evaluation of left arm pain with associated numbness and weakness.  Symptoms began 4-5 days prior.  She has decreased strength in the upper extremity as well as subjective decreased sensation to light touch.  States she has been dropping objects.  No cervical spine pain or tenderness.  Arm does not appear painful to the point of causing weakness.  I some concern for either cervical radiculopathy or stroke.  Spoke with our MRI technician and the patient is too large to fit in our machine.  Plan to discuss the case with neurology to discuss the patient's workup from this point.   11:22 AM Spoke withSpoke with Dr. Gerilyn Pilgrim regarding the case and CT scan results. Patient is > 45 with obesity and HTN which increase her risk of CVA. Advises MRI today at  [facility]. Spoke with MR tech to confirm that patient's weight and measure girth with fit in the MRI at [facility] and told that it will. Spoke with Dr. Fayrene Fearing at [facility] who accepts patient in ED-ED transfer. Appreciate assistance with case.  ____________________________________________  FINAL CLINICAL IMPRESSION(S) / ED DIAGNOSES  Final diagnoses:  Left arm weakness     MEDICATIONS GIVEN DURING THIS VISIT:  Medications  ketorolac (TORADOL) 30 MG/ML injection 30 mg (30 mg Intravenous Given 11/29/16 1108)     NEW OUTPATIENT MEDICATIONS STARTED DURING THIS VISIT:  None  Note:  This document was prepared using Dragon voice recognition software and may include unintentional dictation errors.  Alona Bene, MD Emergency Medicine    Summar Mcglothlin, Arlyss Repress, MD 11/29/16 1430

## 2016-11-29 NOTE — Discharge Instructions (Addendum)
Please read instructions below. You can take advil or aleve as needed for pain. Apply ice or heat to your shoulder for additional relief. Schedule an appointment with the Neurologists. You may need an EMG test done to further evaluate your neck.  Return to the ER for new or concerning symptoms.

## 2016-11-29 NOTE — ED Triage Notes (Signed)
[facility] as a transfer from [facility] where she presented this morning with generalized weakness and malaise since x5 days. Pt states she feels as though she is weak on the left side. Pt is moving all extremities without deficit or weakness [facility] Accepting MD - Dr Fayrene FearingJames. Pt is A&Ox4. C/o headache, stated pain level of 7/10. EMS v/s 146/104, hr 60-70 bpm normal sinus, resp 18 and regular, sp02 98% RA. 18g located in right [facility].

## 2016-11-29 NOTE — ED Provider Notes (Signed)
Care assumed from Fayrene HelperBowie Tran, PA-C, at shift change pending MRI brain and c-spine. Pt will he presented to [facility] the ED for left upper extremity weakness.  CT head was done and negative, however neurology recommended MRI.  Due to patient's body habitus, patient was transported to this ED for imaging.  Plan to discuss MRI results with neurology for disposition and treatment plan.  MRI brain negative.  MRI C-spine showing degenerative changes with some foraminal narrowing and disc protrusion.  Spoke with Dr. Amada JupiterKirkpatrick with neurology, who recommends patient is safe for discharge with neurology referral.  He recommends EMG to further evaluate left upper extremity paresthesias.  Patient reevaluated prior to discharge, no changes in symptoms throughout ED stay.  No new complaints.  Discussed MRI results with patient and answered questions to the best my ability.  Will discharge with neurology referral.  Symptomatic management discussed.  Patient is safe for discharge.  Discussed results, findings, treatment and follow up. Patient advised of return precautions. Patient verbalized understanding and agreed with plan.    Talisha Erby, [place] N, PA-C 11/29/16 2326    Tegeler, Canary Brimhristopher J, MD 11/30/16 [phone]

## 2016-11-29 NOTE — ED Notes (Signed)
Pt placed on transport monitor while in hall

## 2016-11-29 NOTE — ED Provider Notes (Signed)
This is a 46 year old female with history of hypertension obesity transfer from [facility] emergency department to the ER for further evaluation of left arm pain and weakness.  Patient reports for the past 4-5 days she has had headache, weakness and pain to left arm.  She has been dropping objects while holding with a half hand.  No prior history of prior stroke.  Initial evaluation including basic labs, chest x-ray, as well as head and cervical spine CT without contrast that shows no acute pathology.  Neurology was consulted and recommend MRI to rule out potential stroke.  Due to the patient's body habitus, she was transferred here for MRI testing.  Patient is currently stable.  She is aware of plan.  3:43 PM Pt sign out to oncoming provider.  Currently awaits brain and cervical spine MRI.  Result should be discuss with neurology to determine disposition.  Pt received toradol for her headache since pt is allergic to other pain medications.  Pt is aware of plan.    BP (!) 157/95   Pulse (!) 53   Temp 98.3 F (36.8 C) (Oral)   Resp 16   Ht 5\' 11"  (1.803 m)   Wt 136.1 kg (300 lb)   LMP 03/11/2012   SpO2 97%   BMI 41.84 kg/m   Results for orders placed or performed during the hospital encounter of 11/29/16  Basic metabolic panel  Result Value Ref Range   Sodium 137 135 - 145 mmol/L   Potassium 3.8 3.5 - 5.1 mmol/L   Chloride 102 101 - 111 mmol/L   CO2 28 22 - 32 mmol/L   Glucose, Bld 110 (H) 65 - 99 mg/dL   BUN 11 6 - 20 mg/dL   Creatinine, Ser 8.29 0.44 - 1.00 mg/dL   Calcium 9.2 8.9 - 56.2 mg/dL   GFR calc non Af Amer >60 >60 mL/min   GFR calc Af Amer >60 >60 mL/min   Anion gap 7 5 - 15  CBC with Differential  Result Value Ref Range   WBC 7.5 4.0 - 10.5 K/uL   RBC 4.34 3.87 - 5.11 MIL/uL   Hemoglobin 12.7 12.0 - 15.0 g/dL   HCT 13.0 86.5 - 78.4 %   MCV 87.1 78.0 - 100.0 fL   MCH 29.3 26.0 - 34.0 pg   MCHC 33.6 30.0 - 36.0 g/dL   RDW 69.6 29.5 - 28.4 %   Platelets 240 150 - 400  K/uL   Neutrophils Relative % 62 %   Neutro Abs 4.7 1.7 - 7.7 K/uL   Lymphocytes Relative 30 %   Lymphs Abs 2.3 0.7 - 4.0 K/uL   Monocytes Relative 6 %   Monocytes Absolute 0.4 0.1 - 1.0 K/uL   Eosinophils Relative 2 %   Eosinophils Absolute 0.1 0.0 - 0.7 K/uL   Basophils Relative 0 %   Basophils Absolute 0.0 0.0 - 0.1 K/uL  Troponin I  Result Value Ref Range   Troponin I <0.03 <0.03 ng/mL   Dg Chest 2 View  Result Date: 11/29/2016 CLINICAL DATA:  46 year old female with left-sided chest pain and arm weakness for 1 week. EXAM: CHEST  2 VIEW COMPARISON:  07/08/2016 FINDINGS: The heart size and mediastinal contours are within normal limits. Both lungs are clear. The visualized skeletal structures are unremarkable. IMPRESSION: No active cardiopulmonary disease. Electronically Signed   By: Sande Brothers M.D.   On: 11/29/2016 09:20   Ct Head Wo Contrast  Result Date: 11/29/2016 CLINICAL DATA:  Left-sided  weakness for several days, getting worse. No known injury. EXAM: CT HEAD WITHOUT CONTRAST CT CERVICAL SPINE WITHOUT CONTRAST TECHNIQUE: Multidetector CT imaging of the head and cervical spine was performed following the standard protocol without intravenous contrast. Multiplanar CT image reconstructions of the cervical spine were also generated. COMPARISON:  09/29/2003 FINDINGS: CT HEAD FINDINGS Brain: No acute intracranial abnormality. Specifically, no hemorrhage, hydrocephalus, mass lesion, acute infarction, or significant intracranial injury. Vascular: No hyperdense vessel or unexpected calcification. Skull: No acute calvarial abnormality. Sinuses/Orbits: Visualized paranasal sinuses and mastoids clear. Orbital soft tissues unremarkable. Other: None CT CERVICAL SPINE FINDINGS Alignment: Normal alignment. Skull base and vertebrae: No fracture Soft tissues and spinal canal: Prevertebral soft tissues are normal. No epidural or paraspinal hematoma. Disc levels:  Maintained.  No neural foraminal  narrowing. Upper chest: Negative Other: None IMPRESSION: No intracranial abnormality. No bony abnormality in the cervical spine. Electronically Signed   By: Charlett NoseKevin  Dover M.D.   On: 11/29/2016 10:48   Ct Cervical Spine Wo Contrast  Result Date: 11/29/2016 CLINICAL DATA:  Left-sided weakness for several days, getting worse. No known injury. EXAM: CT HEAD WITHOUT CONTRAST CT CERVICAL SPINE WITHOUT CONTRAST TECHNIQUE: Multidetector CT imaging of the head and cervical spine was performed following the standard protocol without intravenous contrast. Multiplanar CT image reconstructions of the cervical spine were also generated. COMPARISON:  09/29/2003 FINDINGS: CT HEAD FINDINGS Brain: No acute intracranial abnormality. Specifically, no hemorrhage, hydrocephalus, mass lesion, acute infarction, or significant intracranial injury. Vascular: No hyperdense vessel or unexpected calcification. Skull: No acute calvarial abnormality. Sinuses/Orbits: Visualized paranasal sinuses and mastoids clear. Orbital soft tissues unremarkable. Other: None CT CERVICAL SPINE FINDINGS Alignment: Normal alignment. Skull base and vertebrae: No fracture Soft tissues and spinal canal: Prevertebral soft tissues are normal. No epidural or paraspinal hematoma. Disc levels:  Maintained.  No neural foraminal narrowing. Upper chest: Negative Other: None IMPRESSION: No intracranial abnormality. No bony abnormality in the cervical spine. Electronically Signed   By: Charlett NoseKevin  Dover M.D.   On: 11/29/2016 10:48      Fayrene Helperran, Adley Castello, PA-C 11/29/16 1544    Tegeler, Canary Brimhristopher J, MD 11/29/16 [phone]

## 2016-11-29 NOTE — ED Triage Notes (Signed)
Pt c/o left arm pain and increasing weakness in left arm since Monday.

## 2017-01-30 ENCOUNTER — Ambulatory Visit: Payer: Self-pay | Admitting: Neurology

## 2017-01-31 ENCOUNTER — Encounter: Payer: Self-pay | Admitting: Neurology

## 2017-01-31 ENCOUNTER — Ambulatory Visit: Payer: BC Managed Care – PPO | Admitting: Neurology

## 2017-01-31 ENCOUNTER — Encounter (INDEPENDENT_AMBULATORY_CARE_PROVIDER_SITE_OTHER): Payer: Self-pay

## 2017-01-31 VITALS — BP 179/94 | HR 61 | Ht 71.0 in | Wt 322.6 lb

## 2017-01-31 DIAGNOSIS — M5412 Radiculopathy, cervical region: Secondary | ICD-10-CM | POA: Diagnosis not present

## 2017-01-31 MED ORDER — GABAPENTIN 100 MG PO CAPS: 100.0000 mg | ORAL_CAPSULE | Freq: Three times a day (TID) | ORAL | 2 refills | Status: AC

## 2017-01-31 NOTE — Patient Instructions (Signed)
EMG/NCS Referral to Neurosurgery Start Gabapentin/Neurontin Physicalt therapy [facility] Gabapentin capsules or tablets What is this medicine? GABAPENTIN (GA ba pen tin) is used to control partial seizures in adults with epilepsy. It is also used to treat certain types of nerve pain. This medicine may be used for other purposes; ask your health care provider or pharmacist if you have questions. COMMON BRAND NAME(S): Active-PAC with Gabapentin, Gabarone, Neurontin What should I tell my health care provider before I take this medicine? They need to know if you have any of these conditions: -kidney disease -suicidal thoughts, plans, or attempt; a previous suicide attempt by you or a family member -an unusual or allergic reaction to gabapentin, other medicines, foods, dyes, or preservatives -pregnant or trying to get pregnant -breast-feeding How should I use this medicine? Take this medicine by mouth with a glass of water. Follow the directions on the prescription label. You can take it with or without food. If it upsets your stomach, take it with food.Take your medicine at regular intervals. Do not take it more often than directed. Do not stop taking except on your doctor's advice. If you are directed to break the 600 or 800 mg tablets in half as part of your dose, the extra half tablet should be used for the next dose. If you have not used the extra half tablet within 28 days, it should be thrown away. A special MedGuide will be given to you by the pharmacist with each prescription and refill. Be sure to read this information carefully each time. Talk to your pediatrician regarding the use of this medicine in children. Special care may be needed. Overdosage: If you think you have taken too much of this medicine contact a poison control center or emergency room at once. NOTE: This medicine is only for you. Do not share this medicine with others. What if I miss a dose? If  you miss a dose, take it as soon as you can. If it is almost time for your next dose, take only that dose. Do not take double or extra doses. What may interact with this medicine? Do not take this medicine with any of the following medications: -other gabapentin products This medicine may also interact with the following medications: -alcohol -antacids -antihistamines for allergy, cough and cold -certain medicines for anxiety or sleep -certain medicines for depression or psychotic disturbances -homatropine; hydrocodone -naproxen -narcotic medicines (opiates) for pain -phenothiazines like chlorpromazine, mesoridazine, prochlorperazine, thioridazine This list may not describe all possible interactions. Give your health care provider a list of all the medicines, herbs, non-prescription drugs, or dietary supplements you use. Also tell them if you smoke, drink alcohol, or use illegal drugs. Some items may interact with your medicine. What should I watch for while using this medicine? Visit your doctor or health care professional for regular checks on your progress. You may want to keep a record at home of how you feel your condition is responding to treatment. You may want to share this information with your doctor or health care professional at each visit. You should contact your doctor or health care professional if your seizures get worse or if you have any new types of seizures. Do not stop taking this medicine or any of your seizure medicines unless instructed by your doctor or health care professional. Stopping your medicine suddenly can increase your seizures or their severity. Wear a medical identification bracelet or chain if you are taking this medicine for seizures, and  carry a card that lists all your medications. You may get drowsy, dizzy, or have blurred vision. Do not drive, use machinery, or do anything that needs mental alertness until you know how this medicine affects you. To reduce  dizzy or fainting spells, do not sit or stand up quickly, especially if you are an older patient. Alcohol can increase drowsiness and dizziness. Avoid alcoholic drinks. Your mouth may get dry. Chewing sugarless gum or sucking hard candy, and drinking plenty of water will help. The use of this medicine may increase the chance of suicidal thoughts or actions. Pay special attention to how you are responding while on this medicine. Any worsening of mood, or thoughts of suicide or dying should be reported to your health care professional right away. Women who become pregnant while using this medicine may enroll in the [place] American Antiepileptic Drug Pregnancy Registry by calling [phone]. This registry collects information about the safety of antiepileptic drug use during pregnancy. What side effects may I notice from receiving this medicine? Side effects that you should report to your doctor or health care professional as soon as possible: -allergic reactions like skin rash, itching or hives, swelling of the face, lips, or tongue -worsening of mood, thoughts or actions of suicide or dying Side effects that usually do not require medical attention (report to your doctor or health care professional if they continue or are bothersome): -constipation -difficulty walking or controlling muscle movements -dizziness -nausea -slurred speech -tiredness -tremors -weight gain This list may not describe all possible side effects. Call your doctor for medical advice about side effects. You may report side effects to FDA at 1-800-FDA-1088. Where should I keep my medicine? Keep out of reach of children. This medicine may cause accidental overdose and death if it taken by other adults, children, or pets. Mix any unused medicine with a substance like cat litter or coffee grounds. Then throw the medicine away in a sealed container like a sealed bag or a coffee can with a lid. Do not use the medicine after the  expiration date. Store at room temperature between 15 and 30 degrees C (59 and 86 degrees F). NOTE: This sheet is a summary. It may not cover all possible information. If you have questions about this medicine, talk to your doctor, pharmacist, or health care provider.  2018 Elsevier/Gold Standard (2013-03-12 15:26:50)

## 2017-01-31 NOTE — Progress Notes (Signed)
[facility]    Provider:  Dr Lucia GaskinsAhern Referring Provider: ED Primary Care Physician:  ED  CC:  Arm pain  HPI:  Destiny Delgado is a 47 y.o. female here as a referral for left arm pain and weakness. She has aching in the left which radiates down the arm. She can also have tingling in digits 1-3 of the left arm. She has weakness of the left arm.She has chronic radiculopathy of the right arm and weakness. The left arm symptoms started in September and continues to be painful, can be significantly painful and has sent her to the ED. She has tried Alleve and ibuprofen which does not help. Sleeping on her arm makes it worse. Also using her left arm a lot makes it worse. Also worse positionally. No changes in bowel or bladder. The pain is continuous on the left with superimposed worsening. No other focal neurologic deficits, associated symptoms, inciting events or modifiable factors.  Reviewed notes, labs and imaging from outside physicians, which showed:  Patient was seen in the ED 11/29/2016 for left arm pain and weakness. No leg or face symptoms. Pain through the shoulder to the entire left arm. She has been dropping things, left arm feels heavy and slightly numb. No vision of speech changes.  Personally reviewed images and MRI of the brain unremarkable. MRI cervical spine showed right possible C5 impingement and mild-moderate right c7 but bilateral c6 foraminal narrowing.  Review of Systems: Patient complains of symptoms per HPI as well as the following symptoms: arm pain, headache, anemia, feeling cold, increased thirst. Pertinent negatives and positives per HPI. All others negative.   Social History   Socioeconomic History  . Marital status: Married    Spouse name: Not on file  . Number of children: 4  . Years of education: Not on file  . Highest education level: Some college, no degree  Social Needs  . Financial resource strain: Not on file  . Food insecurity - worry:  Not on file  . Food insecurity - inability: Not on file  . Transportation needs - medical: Not on file  . Transportation needs - non-medical: Not on file  Occupational History  . Not on file  Tobacco Use  . Smoking status: Never Smoker  . Smokeless tobacco: Never Used  Substance and Sexual Activity  . Alcohol use: Yes    Comment: only wine on special occasions  . Drug use: No  . Sexual activity: Yes    Partners: Male    Birth control/protection: Surgical    Comment: BTL  Other Topics Concern  . Not on file  Social History Narrative   Lives at home with her husband   Right handed   Drinks one-two 20 oz bottles     Family History  Problem Relation Age of Onset  . Stroke Father   . Hypertension Father   . Diabetes Father   . Heart disease Mother   . Breast cancer Mother   . Thyroid disease Mother   . Anemia Mother   . Diabetes Mother   . Diabetes Brother   . Asthma Daughter   . Diabetes Paternal Aunt   . Diabetes Maternal Aunt   . Diabetes Maternal Uncle   . Diabetes Paternal Uncle     Past Medical History:  Diagnosis Date  . Anemia   . Asthma   . Complication of anesthesia    difficult to awaken  . Head ache   . History of blood transfusion  01/2012   at [facility]  . History of chicken pox   . Hypertension   . Obesity   . Renal disorder   . SVD (spontaneous vaginal delivery)    x 4    Past Surgical History:  Procedure Laterality Date  . ABDOMINAL HYSTERECTOMY    . BILATERAL SALPINGECTOMY Bilateral 03/19/2012   Procedure: BILATERAL SALPINGECTOMY;  Surgeon: Esmeralda Arthur, MD;  Location: [facility];  Service: Gynecology;  Laterality: Bilateral;  robotic assisted   . CHOLECYSTECTOMY    . CYSTOSCOPY W/ RETROGRADES Bilateral 03/19/2012   Procedure: CYSTOSCOPY WITH RETROGRADE PYELOGRAM;  Surgeon: Kathi Ludwig, MD;  Location: [facility];  Service: Urology;  Laterality: Bilateral;  with lighted bil stents  . DILITATION & CURRETTAGE/HYSTROSCOPY WITH  THERMACHOICE ABLATION  02/26/2012   Procedure: DILATATION & CURETTAGE/HYSTEROSCOPY WITH THERMACHOICE ABLATION;  Surgeon: Michael Litter, MD;  Location: [facility];  Service: Gynecology;  Laterality: N/A;  . HYSTEROSCOPY    . ROBOTIC ASSISTED TOTAL HYSTERECTOMY N/A 03/19/2012   Procedure: ROBOTIC ASSISTED TOTAL HYSTERECTOMY;  Surgeon: Esmeralda Arthur, MD;  Location: [facility];  Service: Gynecology;  Laterality: N/A;  . TUBAL LIGATION      Current Outpatient Medications  Medication Sig Dispense Refill  . lisinopril (PRINIVIL,ZESTRIL) 10 MG tablet Take 1 tablet (10 mg total) by mouth daily. 30 tablet 3  . naproxen sodium (ALEVE) 220 MG tablet Take 660 mg by mouth daily as needed (pain).    Marland Kitchen gabapentin (NEURONTIN) 100 MG capsule Take 1 capsule (100 mg total) by mouth 3 (three) times daily. 90 capsule 2   No current facility-administered medications for this visit.     Allergies as of 01/31/2017 - Review Complete 01/31/2017  Allergen Reaction Noted  . Dilaudid [hydromorphone hcl] Itching and Rash 03/10/2012  . Hydrocodone Nausea And Vomiting 02/17/2012  . Ultram [tramadol] Itching and Nausea And Vomiting 02/26/2012  . Codeine Itching and Rash 01/15/2012    Vitals: BP (!) 179/94 (BP Location: Left Arm, Patient Position: Sitting) Comment: md aware  Pulse 61   Ht 5\' 11"  (1.803 m)   Wt (!) 322 lb 9.6 oz (146.3 kg)   LMP 03/11/2012   BMI 44.99 kg/m  Last Weight:  Wt Readings from Last 1 Encounters:  01/31/17 (!) 322 lb 9.6 oz (146.3 kg)   Last Height:   Ht Readings from Last 1 Encounters:  01/31/17 5\' 11"  (1.803 m)    Physical exam: Exam: Gen: NAD, conversant, well nourised, obese, well groomed                     CV: RRR, no MRG. No Carotid Bruits. No peripheral edema, warm, nontender Eyes: Conjunctivae clear without exudates or hemorrhage  Neuro: Detailed Neurologic Exam  Speech:    Speech is normal; fluent and spontaneous with normal comprehension.  Cognition:    The patient  is oriented to person, place, and time;     recent and remote memory intact;     language fluent;     normal attention, concentration,     fund of knowledge Cranial Nerves:    The pupils are equal, round, and reactive to light. The fundi are normal and spontaneous venous pulsations are present. Visual fields are full to finger confrontation. Extraocular movements are intact. Trigeminal sensation is intact and the muscles of mastication are normal. The face is symmetric. The palate elevates in the midline. Hearing intact. Voice is normal. Shoulder shrug is normal. The tongue has normal motion  without fasciculations.   Coordination:    Normal finger to nose and heel to shin. Normal rapid alternating movements.   Gait:    No ataxia  Motor Observation:    No asymmetry, no atrophy, and no involuntary movements noted. Tone:    Normal muscle tone.    Posture:    Posture is normal. normal erect    Strength: Possibly left triceps and right deltoid weakness however may be due to pain.  Otherwise strength is V/V in the upper and lower limbs.      Sensation: intact to LT     Reflex Exam:  DTR's:    Deep tendon reflexes in the upper and lower extremities are symmetric bilaterally.   Toes:    The toes are downgoing bilaterally.   Clonus:    Clonus is absent.      Assessment/Plan:  47 year old with arm pain, neck pain, radiculopathy  Likely bilateral UE radiculopathy Start Neurontin 100mg  three times a day Referral to [facility] for evaluation of surgical or other procedures Physical Therapy If neurontin does not help can increase or try a medrol dosepak Morbid obesity: [facility], gave her information so she can call, no referral is needed EMG/NCS  Orders Placed This Encounter  Procedures  . Ambulatory referral to Neurosurgery  . Ambulatory referral to Physical Therapy  . Ambulatory referral to [facility]  . NCV with EMG(electromyography)     Naomie Dean, MD  [facility] [address] [place], [place] [address]  Phone [phone] Fax [phone]

## 2017-02-02 DIAGNOSIS — M5412 Radiculopathy, cervical region: Secondary | ICD-10-CM | POA: Insufficient documentation

## 2017-02-17 ENCOUNTER — Encounter: Payer: Self-pay | Admitting: Neurology

## 2017-02-19 ENCOUNTER — Telehealth: Payer: Self-pay | Admitting: Neurology

## 2017-02-20 ENCOUNTER — Encounter: Payer: Self-pay | Admitting: Neurology

## 2017-03-13 ENCOUNTER — Other Ambulatory Visit: Payer: Self-pay

## 2017-03-13 ENCOUNTER — Encounter (HOSPITAL_BASED_OUTPATIENT_CLINIC_OR_DEPARTMENT_OTHER): Payer: Self-pay

## 2017-03-13 ENCOUNTER — Emergency Department (HOSPITAL_BASED_OUTPATIENT_CLINIC_OR_DEPARTMENT_OTHER): Payer: BC Managed Care – PPO

## 2017-03-13 ENCOUNTER — Emergency Department (HOSPITAL_BASED_OUTPATIENT_CLINIC_OR_DEPARTMENT_OTHER)
Admission: EM | Admit: 2017-03-13 | Discharge: 2017-03-13 | Disposition: A | Discharge status: Home / Self Care | Payer: BC Managed Care – PPO | Attending: Emergency Medicine | Admitting: Emergency Medicine

## 2017-03-13 DIAGNOSIS — J209 Acute bronchitis, unspecified: Secondary | ICD-10-CM

## 2017-03-13 DIAGNOSIS — J019 Acute sinusitis, unspecified: Secondary | ICD-10-CM | POA: Diagnosis not present

## 2017-03-13 DIAGNOSIS — I10 Essential (primary) hypertension: Secondary | ICD-10-CM | POA: Insufficient documentation

## 2017-03-13 DIAGNOSIS — B9689 Other specified bacterial agents as the cause of diseases classified elsewhere: Secondary | ICD-10-CM | POA: Diagnosis not present

## 2017-03-13 DIAGNOSIS — Z79899 Other long term (current) drug therapy: Secondary | ICD-10-CM | POA: Diagnosis not present

## 2017-03-13 DIAGNOSIS — R05 Cough: Secondary | ICD-10-CM | POA: Diagnosis present

## 2017-03-13 DIAGNOSIS — J45909 Unspecified asthma, uncomplicated: Secondary | ICD-10-CM | POA: Insufficient documentation

## 2017-03-13 MED ORDER — AMOXICILLIN-POT CLAVULANATE 875-125 MG PO TABS: 1.0000 | ORAL_TABLET | Freq: Two times a day (BID) | ORAL | 0 refills | Status: AC

## 2017-03-13 MED ORDER — BENZONATATE 100 MG PO CAPS: 100.0000 mg | ORAL_CAPSULE | Freq: Three times a day (TID) | ORAL | 0 refills | Status: DC

## 2017-03-13 MED FILL — AMOX-CLAV 875-125 MG TABLET: 875-125 | 10 days supply | Qty: 20 | Fill #0

## 2017-03-13 MED FILL — BENZONATATE 100 MG CAPSULE: 100 | 7 days supply | Qty: 21 | Fill #0

## 2017-03-13 NOTE — ED Triage Notes (Signed)
C/o flu like sx x 3 weeks-NAD-steady gait 

## 2017-03-13 NOTE — Discharge Instructions (Signed)
Please only take the cough medicine, Tessalon, at night.  Your cough is a protective reflex to keep the mucus out of your lungs.  You may have diarrhea from the antibiotics.  It is very important that you continue to take the antibiotics even if you get diarrhea unless a medical professional tells you that you may stop taking them.  If you stop too early the bacteria you are being treated for will become stronger and you may need different, more powerful antibiotics that have more side effects and worsening diarrhea.  Please stay well hydrated and consider probiotics as they may decrease the severity of your diarrhea.  Please be aware that if you take any hormonal contraception (birth control pills, nexplanon, the ring, etc) that your birth control will not work while you are taking antibiotics and you need to use back up protection as directed on the birth control medication information insert.   Please take Ibuprofen (Advil, motrin) and Tylenol (acetaminophen) to relieve your pain.  You may take up to 600 MG (3 pills) of normal strength ibuprofen every 8 hours as needed.  In between doses of ibuprofen you make take tylenol, up to 1,000 mg (two extra strength pills).  Do not take more than 3,000 mg tylenol in a 24 hour period.  Please check all medication labels as many medications such as pain and cold medications may contain tylenol.  Do not drink alcohol while taking these medications.  Do not take other NSAID'S while taking ibuprofen (such as aleve or naproxen).  Please take ibuprofen with food to decrease stomach upset.  While in the ED your blood pressure was high.  Please follow up with your primary care doctor or the wellness clinic for repeat evaluation as you may need medication.  High blood pressure can cause long term, potentially serious, damage if left untreated.

## 2017-03-13 NOTE — ED Provider Notes (Signed)
[facility] EMERGENCY DEPARTMENT Provider Note   CSN: [id number] Arrival date & time: 03/13/17  1142     History   Chief Complaint Chief Complaint  Patient presents with  . Cough    HPI Destiny Delgado is a 47 y.o. female who presents today for evaluation of cough and cold-like symptoms for approximately 3 weeks.  She reports that her symptoms were constant and unchanging, however 2-3 days ago they got acutely worse.  She reports that for the past 2-3 days her cough has been increased, she has been having subjective fevers, night sweats and chills and generally not feeling well.  She reports feeling very tired.  She also reports worsening sinus pressure and congestion.  She has been trying ibuprofen and Tylenol at home without significant relief.  No nausea, vomiting, or diarrhea.  No abdominal pain.  She is concerned that she is not getting better and appears to have gotten worse.   HPI  Past Medical History:  Diagnosis Date  . Anemia   . Asthma   . Complication of anesthesia    difficult to awaken  . Head ache   . History of blood transfusion 01/2012   at [facility]  . History of chicken pox   . Hypertension   . Obesity   . Renal disorder   . SVD (spontaneous vaginal delivery)    x 4    Patient Active Problem List   Diagnosis Date Noted  . Cervical radiculopathy 02/02/2017  . Hypertension 01/15/2012  . Head ache   . History of chicken pox     Past Surgical History:  Procedure Laterality Date  . ABDOMINAL HYSTERECTOMY    . BILATERAL SALPINGECTOMY Bilateral 03/19/2012   Procedure: BILATERAL SALPINGECTOMY;  Surgeon: Esmeralda Arthur, MD;  Location: [facility];  Service: Gynecology;  Laterality: Bilateral;  robotic assisted   . CHOLECYSTECTOMY    . CYSTOSCOPY W/ RETROGRADES Bilateral 03/19/2012   Procedure: CYSTOSCOPY WITH RETROGRADE PYELOGRAM;  Surgeon: Kathi Ludwig, MD;  Location: [facility];  Service: Urology;  Laterality: Bilateral;  with lighted bil  stents  . DILITATION & CURRETTAGE/HYSTROSCOPY WITH THERMACHOICE ABLATION  02/26/2012   Procedure: DILATATION & CURETTAGE/HYSTEROSCOPY WITH THERMACHOICE ABLATION;  Surgeon: Michael Litter, MD;  Location: [facility];  Service: Gynecology;  Laterality: N/A;  . HYSTEROSCOPY    . ROBOTIC ASSISTED TOTAL HYSTERECTOMY N/A 03/19/2012   Procedure: ROBOTIC ASSISTED TOTAL HYSTERECTOMY;  Surgeon: Esmeralda Arthur, MD;  Location: [facility];  Service: Gynecology;  Laterality: N/A;  . TUBAL LIGATION      OB History    Gravida Para Term Preterm AB Living   4 4       4    SAB TAB Ectopic Multiple Live Births                   Home Medications    Prior to Admission medications   Medication Sig Start Date End Date Taking? Authorizing Provider  amoxicillin-clavulanate (AUGMENTIN) 875-125 MG tablet Take 1 tablet by mouth every 12 (twelve) hours for 10 days. 03/13/17 03/23/17  Cristina Gong, PA-C  benzonatate (TESSALON) 100 MG capsule Take 1 capsule (100 mg total) by mouth every 8 (eight) hours. Please try to only take at bed time. 03/13/17   Cristina Gong, PA-C  gabapentin (NEURONTIN) 100 MG capsule Take 1 capsule (100 mg total) by mouth 3 (three) times daily. 01/31/17   Anson Fret, MD  lisinopril (PRINIVIL,ZESTRIL) 10 MG tablet Take 1  tablet (10 mg total) by mouth daily. 07/08/16   Maxwell Caul, PA-C  naproxen sodium (ALEVE) 220 MG tablet Take 660 mg by mouth daily as needed (pain).    [provider]    Family History Family History  Problem Relation Age of Onset  . Stroke Father   . Hypertension Father   . Diabetes Father   . Heart disease Mother   . Breast cancer Mother   . Thyroid disease Mother   . Anemia Mother   . Diabetes Mother   . Diabetes Brother   . Asthma Daughter   . Diabetes Paternal Aunt   . Diabetes Maternal Aunt   . Diabetes Maternal Uncle   . Diabetes Paternal Uncle     Social History Social History   Tobacco Use  . Smoking status: Never Smoker  .  Smokeless tobacco: Never Used  Substance Use Topics  . Alcohol use: Yes    Comment: rare  . Drug use: No     Allergies   Dilaudid [hydromorphone hcl]; Hydrocodone; Ultram [tramadol]; and Codeine   Review of Systems Review of Systems  Constitutional: Positive for chills and fever.  HENT: Positive for congestion, postnasal drip, rhinorrhea, sinus pressure, sinus pain and sore throat.   Respiratory: Positive for cough. Negative for shortness of breath.   Cardiovascular: Negative for chest pain.  Gastrointestinal: Negative for constipation, diarrhea and vomiting.  All other systems reviewed and are negative.    Physical Exam Updated Vital Signs BP (!) 156/100 (BP Location: Right Arm)   Pulse 64   Temp 98.9 F (37.2 C) (Oral)   Resp 18   Ht 5\' 11"  (1.803 m)   Wt (!) 146.7 kg (323 lb 6.6 oz)   LMP 03/11/2012   SpO2 98%   BMI 45.11 kg/m   Physical Exam  Constitutional: She is oriented to person, place, and time. She appears well-developed and well-nourished.  HENT:  Head: Normocephalic and atraumatic.  Right Ear: Tympanic membrane, external ear and ear canal normal.  Left Ear: Tympanic membrane, external ear and ear canal normal.  Nose: Mucosal edema and rhinorrhea present. Right sinus exhibits maxillary sinus tenderness and frontal sinus tenderness. Left sinus exhibits maxillary sinus tenderness and frontal sinus tenderness.  Mouth/Throat: Uvula is midline, oropharynx is clear and moist and mucous membranes are normal.  Eyes: Conjunctivae are normal.  Neck: Normal range of motion. Neck supple.  Cardiovascular: Normal rate, regular rhythm, normal heart sounds and intact distal pulses.  No murmur heard. Pulmonary/Chest: Effort normal and breath sounds normal. No stridor. No respiratory distress. She has no wheezes. She exhibits no tenderness.  Abdominal: Soft. She exhibits no distension. There is no tenderness.  Lymphadenopathy:    She has cervical adenopathy.    Neurological: She is alert and oriented to person, place, and time.  Skin: Skin is warm and dry. She is not diaphoretic.  Psychiatric: She has a normal mood and affect. Her behavior is normal.  Nursing note and vitals reviewed.    ED Treatments / Results  Labs (all labs ordered are listed, but only abnormal results are displayed) Labs Reviewed - No data to display  EKG  EKG Interpretation None       Radiology Dg Chest 2 View  Result Date: 03/13/2017 CLINICAL DATA:  Cough, congestion and body aches. EXAM: CHEST  2 VIEW COMPARISON:  11/29/2016 FINDINGS: Cardiomediastinal silhouette is normal. Mediastinal contours appear intact. There is no evidence of focal airspace consolidation, pleural effusion or pneumothorax. Osseous structures  are without acute abnormality. Soft tissues are grossly normal. IMPRESSION: No active cardiopulmonary disease. Electronically Signed   By: Ted Mcalpineobrinka  Dimitrova M.D.   On: 03/13/2017 12:15    Procedures Procedures (including critical care time)  Medications Ordered in ED Medications - No data to display   Initial Impression / Assessment and Plan / ED Course  I have reviewed the triage vital signs and the nursing notes.  Pertinent labs & imaging results that were available during my care of the patient were reviewed by me and considered in my medical decision making (see chart for details).    Patient complaining of symptoms of sinusitis.    Severe symptoms have been present for greater than 10 days with secondary worsening, purulent nasal discharge and maxillary sinus pain.  Concern for acute bacterial rhinosinusitis.  Patient discharged with Augmentin.  Instructions given for warm saline nasal wash and recommendations for follow-up with primary care physician.    CXR with out acute abnormalities.  Advised to only take cough medicine at night so she can sleep.    While in the ED patient was noted to have an elevated blood pressure.  At this time  there is nothing to suggest hypertensive urgency or emergency.  Patient was advised to follow up with their PCP or Wellness clinic regarding their elevated blood pressure.    Final Clinical Impressions(s) / ED Diagnoses   Final diagnoses:  Acute bacterial sinusitis  Acute bronchitis, unspecified organism    ED Discharge Orders        Ordered    amoxicillin-clavulanate (AUGMENTIN) 875-125 MG tablet  Every 12 hours     03/13/17 1429    benzonatate (TESSALON) 100 MG capsule  Every 8 hours     03/13/17 1429       Cristina GongHammond, Elizabeth W, [place] JerseyPA-C 03/14/17 1809    Little, Ambrose Finlandachel Morgan, MD 03/15/17 1606

## 2017-03-19 NOTE — Telephone Encounter (Signed)
Error

## 2017-03-26 ENCOUNTER — Ambulatory Visit (INDEPENDENT_AMBULATORY_CARE_PROVIDER_SITE_OTHER): Payer: BC Managed Care – PPO | Admitting: Neurology

## 2017-03-26 ENCOUNTER — Ambulatory Visit: Payer: BC Managed Care – PPO | Admitting: Neurology

## 2017-03-26 ENCOUNTER — Ambulatory Visit: Payer: BC Managed Care – PPO | Attending: Neurology | Admitting: Physical Therapy

## 2017-03-26 ENCOUNTER — Encounter (INDEPENDENT_AMBULATORY_CARE_PROVIDER_SITE_OTHER): Payer: BC Managed Care – PPO

## 2017-03-26 DIAGNOSIS — Z0289 Encounter for other administrative examinations: Secondary | ICD-10-CM

## 2017-03-26 DIAGNOSIS — G5602 Carpal tunnel syndrome, left upper limb: Secondary | ICD-10-CM

## 2017-03-26 DIAGNOSIS — M5412 Radiculopathy, cervical region: Secondary | ICD-10-CM | POA: Diagnosis not present

## 2017-03-26 NOTE — Progress Notes (Addendum)
Full Name: Destiny Delgado Gender: Female MRN #: [id number] Date of Birth: 30-Apr-2070    Visit Date: 03/26/17 09:06 Age: 47 Years 6 Months Old Examining Physician: Naomie DeanAntonia Ranulfo Kall, MD   History: Right arm radiculopathy. Also left hand numbness and tingling.  Summary: Nerve Conduction Studies were performed on the bilateral upper extremities.  The left Median orthodromic 2nd Digit sensory nerve showed prolonged distal Delgado latency (3.4 ms, N<3.4) and reduced amplitude(7uV, N>10).   The left median/ulnar (palm) comparison nerve showed prolonged distal Delgado latency (Median Palm, 2.5 ms, N<2.2) and abnormal Delgado latency difference (Median Palm-Ulnar Palm, 0,705ms, N<0.4) with a relative median delay.   All remaining nerves were within normal limits (as detailed in tables below). All muscles were within normal limits (as detailed in tables below).  Conclusion: There is left mild Carpal Tunnel Syndrome. No electrophysiologic evidence for right arm radiculopathy however clinical symptoms in conjunction with MRI cervical spine findings suggest right C5 radiculopathy. Has been referred to neurosurgery.      Discussed above findings with patient. Reviewed images of MRI Cervical spine with patient. Discussed the diagnosis of Carpal Tunnel Syndrome and possible treatments. A total of 15 minutes was spent in with this patient face-to-face. Over half this time was spent on counseling patient on the CTS, C5 radiculopathy diagnosis and different therapeutic options available. This does not include time performing the emg/ncs procedure. Referral to Neurosurgery placed.   Orders Placed This Encounter  Procedures  . Ambulatory referral to Neurosurgery     Naomie DeanAntonia Hassell Patras, M.D.  [facility] [address] [place], [place] [address] Tel: [phone] Fax: [phone]        [facility]    Nerve / Sites Muscle Latency Ref. Amplitude Ref. Rel Amp Segments Distance Velocity Ref. Area    ms ms  mV mV %  cm m/s m/s mVms  L Median - APB     Wrist APB 3.5 ?4.4 10.3 ?4.0 100 Wrist - APB 7   34.3     Upper arm APB 7.9  9.3  90.2 Upper arm - Wrist 22 50 ?49 30.5  R Median - APB     Wrist APB 3.5 ?4.4 8.6 ?4.0 100 Wrist - APB 7   24.5     Upper arm APB 7.8  8.1  94.5 Upper arm - Wrist 22 52 ?49 23.2  L Ulnar - ADM     Wrist ADM 2.8 ?3.3 6.7 ?6.0 100 Wrist - ADM 7   21.6     B.Elbow ADM 6.6  6.2  93.3 B.Elbow - Wrist 19 50 ?49 19.2     A.Elbow ADM 8.5  5.9  95.2 A.Elbow - B.Elbow 10 51 ?49 18.1         A.Elbow - Wrist      R Ulnar - ADM     Wrist ADM 2.6 ?3.3 6.4 ?6.0 100 Wrist - ADM 7   21.8     B.Elbow ADM 6.4  5.6  88.7 B.Elbow - Wrist 19 51 ?49 19.7     A.Elbow ADM 8.3  5.6  98.9 A.Elbow - B.Elbow 10 51 ?49 20.2         A.Elbow - Wrist                 SNC    Nerve / Sites Rec. Site Delgado Lat Ref.  Amp Ref. Segments Distance Delgado Diff Ref.    ms ms V V  cm ms ms  L Median, Ulnar - Transcarpal comparison     Median Palm Wrist 2.5 ?2.2 21 ?35 Median Palm - Wrist 8       Ulnar Palm Wrist 2.0 ?2.2 10 ?12 Ulnar Palm - Wrist 8          Median Palm - Ulnar Palm  0.5 ?0.4  R Median, Ulnar - Transcarpal comparison     Median Palm Wrist 2.3 ?2.2 39 ?35 Median Palm - Wrist 8       Ulnar Palm Wrist 2.1 ?2.2 14 ?12 Ulnar Palm - Wrist 8          Median Palm - Ulnar Palm  0.2 ?0.4  L Median - Orthodromic (Dig II, Mid palm)     Dig II Wrist 3.4 ?3.4 7 ?10 Dig II - Wrist 13    R Median - Orthodromic (Dig II, Mid palm)     Dig II Wrist 3.3 ?3.4 19 ?10 Dig II - Wrist 13    L Ulnar - Orthodromic, (Dig V, Mid palm)     Dig V Wrist 2.7 ?3.1 7 ?5 Dig V - Wrist 11    R Ulnar - Orthodromic, (Dig V, Mid palm)     Dig V Wrist 2.7 ?3.1 13 ?5 Dig V - Wrist 74                   F  Wave    Nerve F Lat Ref.   ms ms  L Ulnar - ADM 28.4 ?32.0  R Ulnar - ADM 28.4 ?32.0         EMG full       EMG Summary Table    Spontaneous MUAP Recruitment  Muscle IA Fib PSW Fasc Other Amp Dur. Poly Pattern    L. Deltoid Normal None None None _______ Normal Normal Normal Normal  R. Deltoid Normal None None None _______ Normal Normal Normal Normal  L. Triceps brachii Normal None None None _______ Normal Normal Normal Normal  R. Triceps brachii Normal None None None _______ Normal Normal Normal Normal  L. Pronator teres Normal None None None _______ Normal Normal Normal Normal  R. Pronator teres Normal None None None _______ Normal Normal Normal Normal  L. First dorsal interosseous Normal None None None _______ Normal Normal Normal Normal  R. First dorsal interosseous Normal None None None _______ Normal Normal Normal Normal  L. Opponens pollicis Normal None None None _______ Normal Normal Normal Normal  R. Opponens pollicis Normal None None None _______ Normal Normal Normal Normal  L. Cervical paraspinals (low) Normal None None None _______ Normal Normal Normal Normal  R. Cervical paraspinals (low) Normal None None None _______ Normal Normal Normal Normal

## 2017-03-26 NOTE — Procedures (Signed)
Full Name: Destiny Delgado Gender: Female MRN #: [id number] Date of Birth: January 21, 2071    Visit Date: 03/26/17 09:06 Age: 47 Years 6 Months Old Examining Physician: Naomie DeanAntonia Kasiyah Platter, MD   History: Right arm radiculopathy. Also left hand numbness and tingling.  Summary: Nerve Conduction Studies were performed on the bilateral upper extremities.  The left Median orthodromic 2nd Digit sensory nerve showed prolonged distal peak latency (3.4 ms, N<3.4) and reduced amplitude(7uV, N>10).   The left median/ulnar (palm) comparison nerve showed prolonged distal peak latency (Median Palm, 2.5 ms, N<2.2) and abnormal peak latency difference (Median Palm-Ulnar Palm, 0,275ms, N<0.4) with a relative median delay.   All remaining nerves were within normal limits (as detailed in tables below). All muscles were within normal limits (as detailed in tables below).  Conclusion: There is left mild Carpal Tunnel Syndrome. No electrophysiologic evidence for right arm radiculopathy however clinical symptoms in conjunction with MRI cervical spine findings suggest right C5 radiculopathy. Has been referred to neurosurgery.       ------------------------------- Physician Name, M.D.  [facility] [address] [place], [place] [address] Tel: [phone] Fax: [phone]        [facility]    Nerve / Sites Muscle Latency Ref. Amplitude Ref. Rel Amp Segments Distance Velocity Ref. Area    ms ms mV mV %  cm m/s m/s mVms  L Median - APB     Wrist APB 3.5 ?4.4 10.3 ?4.0 100 Wrist - APB 7   34.3     Upper arm APB 7.9  9.3  90.2 Upper arm - Wrist 22 50 ?49 30.5  R Median - APB     Wrist APB 3.5 ?4.4 8.6 ?4.0 100 Wrist - APB 7   24.5     Upper arm APB 7.8  8.1  94.5 Upper arm - Wrist 22 52 ?49 23.2  L Ulnar - ADM     Wrist ADM 2.8 ?3.3 6.7 ?6.0 100 Wrist - ADM 7   21.6     B.Elbow ADM 6.6  6.2  93.3 B.Elbow - Wrist 19 50 ?49 19.2     A.Elbow ADM 8.5  5.9  95.2 A.Elbow - B.Elbow 10 51 ?49 18.1   A.Elbow - Wrist      R Ulnar - ADM     Wrist ADM 2.6 ?3.3 6.4 ?6.0 100 Wrist - ADM 7   21.8     B.Elbow ADM 6.4  5.6  88.7 B.Elbow - Wrist 19 51 ?49 19.7     A.Elbow ADM 8.3  5.6  98.9 A.Elbow - B.Elbow 10 51 ?49 20.2         A.Elbow - Wrist                 SNC    Nerve / Sites Rec. Site Peak Lat Ref.  Amp Ref. Segments Distance Peak Diff Ref.    ms ms V V  cm ms ms  L Median, Ulnar - Transcarpal comparison     Median Palm Wrist 2.5 ?2.2 21 ?35 Median Palm - Wrist 8       Ulnar Palm Wrist 2.0 ?2.2 10 ?12 Ulnar Palm - Wrist 8          Median Palm - Ulnar Palm  0.5 ?0.4  R Median, Ulnar - Transcarpal comparison     Median Palm Wrist 2.3 ?2.2 39 ?35 Median Palm - Wrist 8       Ulnar Palm Wrist 2.1 ?2.2  14 ?12 Ulnar Palm - Wrist 8          Median Palm - Ulnar Palm  0.2 ?0.4  L Median - Orthodromic (Dig II, Mid palm)     Dig II Wrist 3.4 ?3.4 7 ?10 Dig II - Wrist 13    R Median - Orthodromic (Dig II, Mid palm)     Dig II Wrist 3.3 ?3.4 19 ?10 Dig II - Wrist 13    L Ulnar - Orthodromic, (Dig V, Mid palm)     Dig V Wrist 2.7 ?3.1 7 ?5 Dig V - Wrist 11    R Ulnar - Orthodromic, (Dig V, Mid palm)     Dig V Wrist 2.7 ?3.1 13 ?5 Dig V - Wrist 81                   F  Wave    Nerve F Lat Ref.   ms ms  L Ulnar - ADM 28.4 ?32.0  R Ulnar - ADM 28.4 ?32.0         EMG full       EMG Summary Table    Spontaneous MUAP Recruitment  Muscle IA Fib PSW Fasc Other Amp Dur. Poly Pattern  L. Deltoid Normal None None None _______ Normal Normal Normal Normal  R. Deltoid Normal None None None _______ Normal Normal Normal Normal  L. Triceps brachii Normal None None None _______ Normal Normal Normal Normal  R. Triceps brachii Normal None None None _______ Normal Normal Normal Normal  L. Pronator teres Normal None None None _______ Normal Normal Normal Normal  R. Pronator teres Normal None None None _______ Normal Normal Normal Normal  L. First dorsal interosseous Normal None None None _______ Normal  Normal Normal Normal  R. First dorsal interosseous Normal None None None _______ Normal Normal Normal Normal  L. Opponens pollicis Normal None None None _______ Normal Normal Normal Normal  R. Opponens pollicis Normal None None None _______ Normal Normal Normal Normal  L. Cervical paraspinals (low) Normal None None None _______ Normal Normal Normal Normal  R. Cervical paraspinals (low) Normal None None None _______ Normal Normal Normal Normal

## 2017-03-26 NOTE — Patient Instructions (Signed)
Carpal Tunnel Release Carpal tunnel release is a surgical procedure to relieve numbness and pain in your hand that are caused by carpal tunnel syndrome. Your carpal tunnel is a narrow, hollow space in your wrist. It passes between your wrist bones and a band of connective tissue (transverse carpal ligament). The nerve that supplies most of your hand (median nerve) passes through this space, and so do the connections between your fingers and the muscles of your arm (tendons). Carpal tunnel syndrome makes this space swell and become narrow, and this causes pain and numbness. In carpal tunnel release surgery, a surgeon cuts through the transverse carpal ligament to make more room in the carpal tunnel space. You may have this surgery if other types of treatment have not worked. Tell a health care provider about:  Any allergies you have.  All medicines you are taking, including vitamins, herbs, eye drops, creams, and over-the-counter medicines.  Any problems you or family members have had with anesthetic medicines.  Any blood disorders you have.  Any surgeries you have had.  Any medical conditions you have. What are the risks? Generally, this is a safe procedure. However, problems may occur, including:  Bleeding.  Infection.  Injury to the median nerve.  Need for additional surgery.  What happens before the procedure?  Ask your health care provider about: ? Changing or stopping your regular medicines. This is especially important if you are taking diabetes medicines or blood thinners. ? Taking medicines such as aspirin and ibuprofen. These medicines can thin your blood. Do not take these medicines before your procedure if your health care provider instructs you not to.  Do not eat or drink anything after midnight on the night before the procedure or as directed by your health care provider.  Plan to have someone take you home after the procedure. What happens during the  procedure?  An IV tube may be inserted into a vein.  You will be given one of the following: ? A medicine that numbs the wrist area (local anesthetic). You may also be given a medicine to make you relax (sedative). ? A medicine that makes you go to sleep (general anesthetic).  Your arm, hand, and wrist will be cleaned with a germ-killing solution (antiseptic).  Your surgeon will make a surgical cut (incision) over the palm side of your wrist. The surgeon will pull aside the skin of your wrist to expose the carpal tunnel space.  The surgeon will cut the transverse carpal ligament.  The edges of the incision will be closed with stitches (sutures) or staples.  A bandage (dressing) will be placed over your wrist and wrapped around your hand and wrist. What happens after the procedure?  You may spend some time in a recovery area.  Your blood pressure, heart rate, breathing rate, and blood oxygen level will be monitored often until the medicines you were given have worn off.  You will likely have some pain. You will be given pain medicine.  You may need to wear a splint or a wrist brace over your dressing. This information is not intended to replace advice given to you by your health care provider. Make sure you discuss any questions you have with your health care provider. Document Released: 04/06/2003 Document Revised: 06/22/2015 Document Reviewed: 09/01/2013 Elsevier Interactive Patient Education  2018 [employer].   Cervical Radiculopathy Cervical radiculopathy happens when a nerve in the neck (cervical nerve) is pinched or bruised. This condition can develop because of an injury  or as part of the normal aging process. Pressure on the cervical nerves can cause pain or numbness that runs from the neck all the way down into the arm and fingers. Usually, this condition gets better with rest. Treatment may be needed if the condition does not improve. What are the causes? This condition  may be caused by:  Injury.  Slipped (herniated) disk.  Muscle tightness in the neck because of overuse.  Arthritis.  Breakdown or degeneration in the bones and joints of the spine (spondylosis) due to aging.  Bone spurs that may develop near the cervical nerves.  What are the signs or symptoms? Symptoms of this condition include:  Pain that runs from the neck to the arm and hand. The pain can be severe or irritating. It may be worse when the neck is moved.  Numbness or weakness in the affected arm and hand.  How is this diagnosed? This condition may be diagnosed based on symptoms, medical history, and a physical exam. You may also have tests, including:  X-rays.  CT scan.  MRI.  Electromyogram (EMG).  Nerve conduction tests.  How is this treated? In many cases, treatment is not needed for this condition. With rest, the condition usually gets better over time. If treatment is needed, options may include:  Wearing a soft neck collar for short periods of time.  Physical therapy to strengthen your neck muscles.  Medicines, such as NSAIDs, oral corticosteroids, or spinal injections.  Surgery. This may be needed if other treatments do not help. Various types of surgery may be done depending on the cause of your problems.  Follow these instructions at home: Managing pain  Take over-the-counter and prescription medicines only as told by your health care provider.  If directed, apply ice to the affected area. ? Put ice in a plastic bag. ? Place a towel between your skin and the bag. ? Leave the ice on for 20 minutes, 2-3 times per day.  If ice does not help, you can try using heat. Take a warm shower or warm bath, or use a heat pack as told by your health care provider.  Try a gentle neck and shoulder massage to help relieve symptoms. Activity  Rest as needed. Follow instructions from your health care provider about any restrictions on activities.  Do stretching  and strengthening exercises as told by your health care provider or physical therapist. General instructions  If you were given a soft collar, wear it as told by your health care provider.  Use a flat pillow when you sleep.  Keep all follow-up visits as told by your health care provider. This is important. Contact a health care provider if:  Your condition does not improve with treatment. Get help right away if:  Your pain gets much worse and cannot be controlled with medicines.  You have weakness or numbness in your hand, arm, face, or leg.  You have a high fever.  You have a stiff, rigid neck.  You lose control of your bowels or your bladder (have incontinence).  You have trouble with walking, balance, or speaking. This information is not intended to replace advice given to you by your health care provider. Make sure you discuss any questions you have with your health care provider. Document Released: 10/09/2000 Document Revised: 06/22/2015 Document Reviewed: 03/10/2014 Elsevier Interactive Patient Education  [employer]2018 [employer] Inc[employer]

## 2017-03-26 NOTE — Addendum Note (Signed)
Addended by: Naomie DeanAHERN, Jhoana Upham B on: 03/26/2017 06:06 PM   Modules accepted: Level of Service

## 2017-03-26 NOTE — Progress Notes (Signed)
See procedure note.

## 2017-03-30 ENCOUNTER — Other Ambulatory Visit: Payer: Self-pay

## 2017-03-30 ENCOUNTER — Encounter (HOSPITAL_BASED_OUTPATIENT_CLINIC_OR_DEPARTMENT_OTHER): Payer: Self-pay | Admitting: Adult Health

## 2017-03-30 ENCOUNTER — Emergency Department (HOSPITAL_BASED_OUTPATIENT_CLINIC_OR_DEPARTMENT_OTHER)
Admission: EM | Admit: 2017-03-30 | Discharge: 2017-03-30 | Disposition: A | Discharge status: Home / Self Care | Payer: BC Managed Care – PPO | Attending: Emergency Medicine | Admitting: Emergency Medicine

## 2017-03-30 ENCOUNTER — Emergency Department (HOSPITAL_BASED_OUTPATIENT_CLINIC_OR_DEPARTMENT_OTHER): Payer: BC Managed Care – PPO

## 2017-03-30 DIAGNOSIS — R05 Cough: Secondary | ICD-10-CM | POA: Diagnosis present

## 2017-03-30 DIAGNOSIS — Z79899 Other long term (current) drug therapy: Secondary | ICD-10-CM | POA: Insufficient documentation

## 2017-03-30 DIAGNOSIS — I10 Essential (primary) hypertension: Secondary | ICD-10-CM | POA: Insufficient documentation

## 2017-03-30 DIAGNOSIS — B9789 Other viral agents as the cause of diseases classified elsewhere: Secondary | ICD-10-CM | POA: Insufficient documentation

## 2017-03-30 DIAGNOSIS — J45909 Unspecified asthma, uncomplicated: Secondary | ICD-10-CM | POA: Diagnosis not present

## 2017-03-30 DIAGNOSIS — J069 Acute upper respiratory infection, unspecified: Secondary | ICD-10-CM | POA: Insufficient documentation

## 2017-03-30 NOTE — ED Triage Notes (Signed)
PResents with chills, aching, cough, and headache that came back after last visit. REports she finished the medication for the sinus infection but she feels worse now. Endorses dry hacking cough.

## 2017-03-30 NOTE — Discharge Instructions (Signed)
Your symptoms are likely caused by a viral upper respiratory infection. Antibiotics are not helpful in treating viral infection, the virus should run its course in about 5--7 days. Please make sure you are drinking plenty of fluids. You can treat your symptoms supportively with tylenol/ibuprofen for fevers and pains, Zyrtec and Flonase to help with nasal congestion, as well as netti pot sinus rinses, and over the counter cough syrups and throat lozenges to help with cough. If your symptoms are not improving please follow up with you Primary doctor, you may use the phone number provided to establish primary care.  If you develop persistent fevers, shortness of breath or difficulty breathing, chest pain, severe headache and neck pain, persistent nausea and vomiting or other new or concerning symptoms return to the Emergency department.

## 2017-03-30 NOTE — ED Provider Notes (Signed)
[facility] EMERGENCY DEPARTMENT Provider Note   CSN: [id number] Arrival date & time: 03/30/17  1413     History   Chief Complaint Chief Complaint  Patient presents with  . Cough    HPI  Destiny Delgado is a 47 y.o. Female history of hypertension, asthma, anemia, and headaches, who presents to the ED for evaluation of 5 days of fevers, generalized body aches, cough, nasal congestion and rhinorrhea.  Patient reports she was recently treated for a bacterial sinus infection completed her antibiotics and was starting to feel better than about 5 days later started developing the symptoms.  Reports since gradual onset symptoms have been constant and not improving, she is tried over-the-counter NyQuil and DayQuil, Flonase, and ibuprofen with minimal relief.  Patient denies any other aggravating or alleviating factors.  She reports mild sore throat developed today.  Patient reports generalized soreness from coughing but no chest pain or shortness of breath.  She reports some occasional nausea but she is been able to eat and drink okay, no vomiting, abdominal pain, few episodes of loose stools, no blood in the stool.  Patient denies obvious exposure to the flu, but does report she works at the [employer].      Past Medical History:  Diagnosis Date  . Anemia   . Asthma   . Complication of anesthesia    difficult to awaken  . Head ache   . History of blood transfusion 01/2012   at [facility]  . History of chicken pox   . Hypertension   . Obesity   . Renal disorder   . SVD (spontaneous vaginal delivery)    x 4    Patient Active Problem List   Diagnosis Date Noted  . Cervical radiculopathy 02/02/2017  . Hypertension 01/15/2012  . Head ache   . History of chicken pox     Past Surgical History:  Procedure Laterality Date  . ABDOMINAL HYSTERECTOMY    . BILATERAL SALPINGECTOMY Bilateral 03/19/2012   Procedure: BILATERAL SALPINGECTOMY;  Surgeon: Esmeralda Arthur, MD;  Location: [facility]  ORS;  Service: Gynecology;  Laterality: Bilateral;  robotic assisted   . CHOLECYSTECTOMY    . CYSTOSCOPY W/ RETROGRADES Bilateral 03/19/2012   Procedure: CYSTOSCOPY WITH RETROGRADE PYELOGRAM;  Surgeon: Kathi Ludwig, MD;  Location: [facility] ORS;  Service: Urology;  Laterality: Bilateral;  with lighted bil stents  . DILITATION & CURRETTAGE/HYSTROSCOPY WITH THERMACHOICE ABLATION  02/26/2012   Procedure: DILATATION & CURETTAGE/HYSTEROSCOPY WITH THERMACHOICE ABLATION;  Surgeon: Michael Litter, MD;  Location: [facility] ORS;  Service: Gynecology;  Laterality: N/A;  . HYSTEROSCOPY    . ROBOTIC ASSISTED TOTAL HYSTERECTOMY N/A 03/19/2012   Procedure: ROBOTIC ASSISTED TOTAL HYSTERECTOMY;  Surgeon: Esmeralda Arthur, MD;  Location: [facility] ORS;  Service: Gynecology;  Laterality: N/A;  . TUBAL LIGATION      OB History    Gravida Para Term Preterm AB Living   4 4       4    SAB TAB Ectopic Multiple Live Births                   Home Medications    Prior to Admission medications   Medication Sig Start Date End Date Taking? Authorizing Provider  benzonatate (TESSALON) 100 MG capsule Take 1 capsule (100 mg total) by mouth every 8 (eight) hours. Please try to only take at bed time. 03/13/17   Cristina Gong, PA-C  gabapentin (NEURONTIN) 100 MG capsule Take 1 capsule (100 mg  total) by mouth 3 (three) times daily. 01/31/17   Anson Fret, MD  lisinopril (PRINIVIL,ZESTRIL) 10 MG tablet Take 1 tablet (10 mg total) by mouth daily. 07/08/16   Maxwell Caul, PA-C  naproxen sodium (ALEVE) 220 MG tablet Take 660 mg by mouth daily as needed (pain).    [provider]    Family History Family History  Problem Relation Age of Onset  . Stroke Father   . Hypertension Father   . Diabetes Father   . Heart disease Mother   . Breast cancer Mother   . Thyroid disease Mother   . Anemia Mother   . Diabetes Mother   . Diabetes Brother   . Asthma Daughter   . Diabetes Paternal Aunt   . Diabetes Maternal Aunt    . Diabetes Maternal Uncle   . Diabetes Paternal Uncle     Social History Social History   Tobacco Use  . Smoking status: Never Smoker  . Smokeless tobacco: Never Used  Substance Use Topics  . Alcohol use: Yes    Comment: rare  . Drug use: No     Allergies   Dilaudid [hydromorphone hcl]; Hydrocodone; Ultram [tramadol]; and Codeine   Review of Systems Review of Systems  Constitutional: Positive for chills and fever.  HENT: Positive for congestion, ear pain, rhinorrhea, sinus pressure, sinus pain, sneezing and sore throat. Negative for ear discharge and trouble swallowing.   Eyes: Negative for discharge, redness, itching and visual disturbance.  Respiratory: Positive for cough. Negative for chest tightness, shortness of breath and wheezing.   Cardiovascular: Negative for chest pain.  Gastrointestinal: Positive for diarrhea and nausea. Negative for abdominal pain, blood in stool and vomiting.  Musculoskeletal: Positive for myalgias.  Skin: Negative for rash.  Neurological: Negative for dizziness, light-headedness and headaches.     Physical Exam Updated Vital Signs BP (!) 158/103   Pulse 72   Temp 99.6 F (37.6 C) (Oral)   Resp 20   Ht 5\' 11"  (1.803 m)   Wt (!) 146.5 kg (323 lb)   LMP 03/11/2012   SpO2 98%   BMI 45.05 kg/m   Physical Exam  Constitutional: She appears well-developed and well-nourished. No distress.  HENT:  Head: Normocephalic and atraumatic.  TMs clear with good landmarks, moderate nasal mucosa edema with clear rhinorrhea, posterior oropharynx clear and moist, with some erythema, no edema or exudates, uvula midline  Eyes: Right eye exhibits no discharge. Left eye exhibits no discharge.  Neck: Neck supple.  No rigidity  Cardiovascular: Normal rate, regular rhythm and normal heart sounds.  Pulmonary/Chest: Effort normal and breath sounds normal. No stridor. No respiratory distress. She has no wheezes. She has no rales.  Respirations equal and  unlabored, patient able to speak in full sentences, lungs clear to auscultation bilaterally  Abdominal: Soft. Bowel sounds are normal. She exhibits no distension and no mass. There is no tenderness. There is no guarding.  Musculoskeletal: She exhibits no deformity.  Neurological: She is alert. Coordination normal.  Skin: Skin is warm and dry. Capillary refill takes less than 2 seconds. She is not diaphoretic.  Psychiatric: She has a normal mood and affect. Her behavior is normal.  Nursing note and vitals reviewed.    ED Treatments / Results  Labs (all labs ordered are listed, but only abnormal results are displayed) Labs Reviewed - No data to display  EKG  EKG Interpretation None       Radiology Dg Chest 2 View  Result Date:  03/30/2017 CLINICAL DATA:  Nonproductive cough. EXAM: CHEST  2 VIEW COMPARISON:  Radiographs of March 13, 2017. FINDINGS: The heart size and mediastinal contours are within normal limits. Both lungs are clear. No pneumothorax or pleural effusion is noted. The visualized skeletal structures are unremarkable. IMPRESSION: No active cardiopulmonary disease. Electronically Signed   By: Lupita RaiderJames  Green Jr, M.D.   On: 03/30/2017 14:53    Procedures Procedures (including critical care time)  Medications Ordered in ED Medications - No data to display   Initial Impression / Assessment and Plan / ED Course  I have reviewed the triage vital signs and the nursing notes.  Pertinent labs & imaging results that were available during my care of the patient were reviewed by me and considered in my medical decision making (see chart for details).  Pt presents with nasal congestion and cough. Pt is well appearing and vitals are normal. Lungs CTA on exam. Pt CXR negative for acute infiltrate. Patients symptoms are consistent with URI, likely viral etiology, patient works in the [employer] and likely has been exposed to another virus.  Discussed that antibiotics are not indicated for  viral infections. Pt will be discharged with symptomatic treatment, encouraged Zyrtec, Flonase, and sinus rinses.  Verbalizes understanding and is agreeable with plan. Pt is hemodynamically stable & in NAD prior to dc.  Final Clinical Impressions(s) / ED Diagnoses   Final diagnoses:  Viral URI with cough    ED Discharge Orders    None       Legrand RamsFord, Josel Keo N, PA-C 03/30/17 1724    Tilden Fossaees, Elizabeth, MD 03/31/17 [phone]

## 2017-04-10 ENCOUNTER — Ambulatory Visit (INDEPENDENT_AMBULATORY_CARE_PROVIDER_SITE_OTHER): Payer: Self-pay | Admitting: Family Medicine

## 2017-12-04 ENCOUNTER — Emergency Department (HOSPITAL_BASED_OUTPATIENT_CLINIC_OR_DEPARTMENT_OTHER): Payer: BC Managed Care – PPO

## 2017-12-04 ENCOUNTER — Encounter (HOSPITAL_BASED_OUTPATIENT_CLINIC_OR_DEPARTMENT_OTHER): Payer: Self-pay

## 2017-12-04 ENCOUNTER — Emergency Department (HOSPITAL_BASED_OUTPATIENT_CLINIC_OR_DEPARTMENT_OTHER)
Admission: EM | Admit: 2017-12-04 | Discharge: 2017-12-04 | Disposition: A | Discharge status: Home / Self Care | Payer: BC Managed Care – PPO | Attending: Emergency Medicine | Admitting: Emergency Medicine

## 2017-12-04 ENCOUNTER — Other Ambulatory Visit: Payer: Self-pay

## 2017-12-04 DIAGNOSIS — R079 Chest pain, unspecified: Secondary | ICD-10-CM | POA: Insufficient documentation

## 2017-12-04 DIAGNOSIS — R531 Weakness: Secondary | ICD-10-CM | POA: Insufficient documentation

## 2017-12-04 DIAGNOSIS — I10 Essential (primary) hypertension: Secondary | ICD-10-CM | POA: Diagnosis not present

## 2017-12-04 DIAGNOSIS — J45909 Unspecified asthma, uncomplicated: Secondary | ICD-10-CM | POA: Diagnosis not present

## 2017-12-04 DIAGNOSIS — R42 Dizziness and giddiness: Secondary | ICD-10-CM | POA: Diagnosis present

## 2017-12-04 DIAGNOSIS — Z79899 Other long term (current) drug therapy: Secondary | ICD-10-CM | POA: Diagnosis not present

## 2017-12-04 LAB — CBC
HEMATOCRIT: 38 % (ref 36.0–46.0)
Hemoglobin: 12.4 g/dL (ref 12.0–15.0)
MCH: 28.7 pg (ref 26.0–34.0)
MCHC: 32.6 g/dL (ref 30.0–36.0)
MCV: 88 fL (ref 80.0–100.0)
NRBC: 0 % (ref 0.0–0.2)
PLATELETS: 252 10*3/uL (ref 150–400)
RBC: 4.32 MIL/uL (ref 3.87–5.11)
RDW: 12.3 % (ref 11.5–15.5)
WBC: 7.4 10*3/uL (ref 4.0–10.5)

## 2017-12-04 LAB — BASIC METABOLIC PANEL
Anion gap: 9 (ref 5–15)
BUN: 10 mg/dL (ref 6–20)
CALCIUM: 8.6 mg/dL — AB (ref 8.9–10.3)
CO2: 27 mmol/L (ref 22–32)
Chloride: 100 mmol/L (ref 98–111)
Creatinine, Ser: 0.95 mg/dL (ref 0.44–1.00)
Glucose, Bld: 142 mg/dL — ABNORMAL HIGH (ref 70–99)
Potassium: 3.3 mmol/L — ABNORMAL LOW (ref 3.5–5.1)
SODIUM: 136 mmol/L (ref 135–145)

## 2017-12-04 LAB — TROPONIN I: Troponin I: <0.03 ng/mL (ref <0.03)

## 2017-12-04 MED ORDER — SODIUM CHLORIDE 0.9 % IV BOLUS
1000.0000 mL | Freq: Once | INTRAVENOUS | Status: AC
  Administered 2017-12-04: 1000 mL via INTRAVENOUS

## 2017-12-04 NOTE — ED Triage Notes (Addendum)
Pt states "just not feeling good" x 3 days-today at work she felt like she was going to pass out, dizziness-had CP last week-denies at this time-NAD-steady gait-pt states she drove self to ED

## 2017-12-04 NOTE — ED Notes (Signed)
ED Provider at bedside. 

## 2017-12-04 NOTE — Discharge Instructions (Addendum)
You have been diagnosed today with Generalized Weakness.  At this time there does not appear to be the presence of an emergent medical condition, however there is always the potential for conditions to worsen. Please read and follow the below instructions.  Please return to the Emergency Department immediately for any new or worsening symptoms or if your symptoms do not improve. Please be sure to follow up with your Primary Care Provider as soon as possible regarding your visit today; please call their office to schedule an appointment even if you are feeling better for a follow-up visit. As discussed you may be experiencing an early viral infection, please drink plenty water and get plenty of rest to help with your symptoms.  Please follow-up with your primary care provider regarding your visit today.  You may schedule an appointment with the cardiology group if needed for near syncope. Your blood pressure was also noted to be elevated today, please be sure to take your blood pressure medication as prescribed by your primary doctor and follow-up for blood pressure recheck and medication management.  Get help right away if: You have sudden weakness. You have trouble breathing or shortness of breath. You have problems with your vision. You have trouble talking or swallowing. You have trouble standing or walking. You have chest pain. You are light-headed. You pass out (lose consciousness). Get help right away if: You feel confused. Your vision is blurry. You feel faint or pass out. You have a severe headache. You have severe abdominal, pelvic, or back pain. You have chest pain, shortness of breath, or an irregular or fast heartbeat. You are unable to urinate or you urinate less than normal. You develop abnormal bleeding, such as bleeding from the rectum, vagina, nose, lungs, or nipples. You vomit blood.  Please read the additional information packets attached to your discharge  summary.  Do not take your medicine if  develop an itchy rash, swelling in your mouth or lips, or difficulty breathing.

## 2017-12-04 NOTE — ED Provider Notes (Signed)
[facility] EMERGENCY DEPARTMENT Provider Note   CSN: [id number] Arrival date & time: 12/04/17  1118     History   Chief Complaint Chief Complaint  Patient presents with  . Near Syncope    HPI Destiny Delgado is a 47 y.o. female presenting for 3 days of feeling unwell, fatigue without pain.  Patient states that she was at work around 845 this morning when she began feeling dizzy and like she was going to pass out.  Patient remained at work for multiple hours and drove herself here to the emergency department.  Patient describes her dizziness as a lightheadedness, no room spinning, that is persistent and worse with activity.  Patient denies associated pain, vision changes or shortness of breath with her lightheadedness, states that she has felt similar symptoms in 2017 when she was diagnosed with pneumonia.  Patient has been treating her symptoms over the past 3 days with ibuprofen with minimal relief.  Additionally patient endorses one episode of chest pain/shortness of breath on 11/24/2017.  This lasted throughout the night and self resolved, patient did not seek medical care for her chest pain.  Patient denies recurrence of chest pain/shortness of breath since 11/24/2017.  Patient denies recent surgery or trauma, exogenous hormone use, history of blood clot, unilateral leg swelling, personal diagnosis of cancer, recent immobilization.   HPI  Past Medical History:  Diagnosis Date  . Anemia   . Asthma   . Complication of anesthesia    difficult to awaken  . Head ache   . History of blood transfusion 01/2012   at [facility]  . History of chicken pox   . Hypertension   . Obesity   . Renal disorder   . SVD (spontaneous vaginal delivery)    x 4    Patient Active Problem List   Diagnosis Date Noted  . Cervical radiculopathy 02/02/2017  . Hypertension 01/15/2012  . Head ache   . History of chicken pox     Past Surgical History:  Procedure Laterality Date  .  ABDOMINAL HYSTERECTOMY    . BILATERAL SALPINGECTOMY Bilateral 03/19/2012   Procedure: BILATERAL SALPINGECTOMY;  Surgeon: Esmeralda Arthur, MD;  Location: [facility];  Service: Gynecology;  Laterality: Bilateral;  robotic assisted   . CHOLECYSTECTOMY    . CYSTOSCOPY W/ RETROGRADES Bilateral 03/19/2012   Procedure: CYSTOSCOPY WITH RETROGRADE PYELOGRAM;  Surgeon: Kathi Ludwig, MD;  Location: [facility];  Service: Urology;  Laterality: Bilateral;  with lighted bil stents  . DILITATION & CURRETTAGE/HYSTROSCOPY WITH THERMACHOICE ABLATION  02/26/2012   Procedure: DILATATION & CURETTAGE/HYSTEROSCOPY WITH THERMACHOICE ABLATION;  Surgeon: Michael Litter, MD;  Location: [facility];  Service: Gynecology;  Laterality: N/A;  . HYSTEROSCOPY    . ROBOTIC ASSISTED TOTAL HYSTERECTOMY N/A 03/19/2012   Procedure: ROBOTIC ASSISTED TOTAL HYSTERECTOMY;  Surgeon: Esmeralda Arthur, MD;  Location: [facility];  Service: Gynecology;  Laterality: N/A;  . TUBAL LIGATION       OB History    Gravida  4   Para  4   Term      Preterm      AB      Living  4     SAB      TAB      Ectopic      Multiple      Live Births               Home Medications    Prior to Admission medications  Medication Sig Start Date End Date Taking? Authorizing Provider  benzonatate (TESSALON) 100 MG capsule Take 1 capsule (100 mg total) by mouth every 8 (eight) hours. Please try to only take at bed time. 03/13/17   Cristina Gong, PA-C  gabapentin (NEURONTIN) 100 MG capsule Take 1 capsule (100 mg total) by mouth 3 (three) times daily. 01/31/17   Anson Fret, MD  lisinopril (PRINIVIL,ZESTRIL) 10 MG tablet Take 1 tablet (10 mg total) by mouth daily. 07/08/16   Maxwell Caul, PA-C  naproxen sodium (ALEVE) 220 MG tablet Take 660 mg by mouth daily as needed (pain).    [provider]    Family History Family History  Problem Relation Age of Onset  . Stroke Father   . Hypertension Father   . Diabetes Father   .  Heart disease Mother   . Breast cancer Mother   . Thyroid disease Mother   . Anemia Mother   . Diabetes Mother   . Diabetes Brother   . Asthma Daughter   . Diabetes Paternal Aunt   . Diabetes Maternal Aunt   . Diabetes Maternal Uncle   . Diabetes Paternal Uncle     Social History Social History   Tobacco Use  . Smoking status: Never Smoker  . Smokeless tobacco: Never Used  Substance Use Topics  . Alcohol use: Yes    Comment: rare  . Drug use: No     Allergies   Dilaudid [hydromorphone hcl]; Hydrocodone; Ultram [tramadol]; and Codeine   Review of Systems Review of Systems  Constitutional: Positive for fatigue. Negative for chills and fever.  HENT: Negative.  Negative for rhinorrhea and sore throat.   Eyes: Negative.  Negative for visual disturbance.  Respiratory: Negative.  Negative for cough and shortness of breath.   Cardiovascular: Negative.  Negative for chest pain.  Gastrointestinal: Negative.  Negative for abdominal pain, blood in stool, diarrhea, nausea and vomiting.  Genitourinary: Negative.  Negative for difficulty urinating, dysuria, flank pain, hematuria and pelvic pain.  Musculoskeletal: Negative.  Negative for arthralgias and myalgias.  Skin: Negative.  Negative for rash.  Neurological: Positive for dizziness and light-headedness. Negative for syncope, weakness and headaches.   Physical Exam Updated Vital Signs BP (!) 140/91   Pulse 70   Temp 98.4 F (36.9 C) (Oral)   Resp 16   Ht 5\' 11"  (1.803 m)   Wt (!) 155.1 kg   LMP 03/11/2012   SpO2 99%   BMI 47.70 kg/m   Physical Exam  Constitutional: She is oriented to person, place, and time. She appears well-developed and well-nourished. No distress.  HENT:  Head: Normocephalic and atraumatic.  Right Ear: Hearing, tympanic membrane, external ear and ear canal normal.  Left Ear: Hearing, tympanic membrane, external ear and ear canal normal.  Nose: Nose normal.  Mouth/Throat: Uvula is midline,  oropharynx is clear and moist and mucous membranes are normal.  The patient has normal phonation and is in control of secretions. No stridor.  Midline uvula without edema. Soft palate rises symmetrically. *No tonsillar erythema, swelling or exudates. Tongue protrusion is normal, floor of mouth is soft. No trismus. No creptius on neck palpation. No gingival erythema or fluctuance noted. Mucus membranes moist. No pallor noted.  Eyes: Pupils are equal, round, and reactive to light. Conjunctivae and EOM are normal.  Neck: Trachea normal, normal range of motion, full passive range of motion without pain and phonation normal. Neck supple. No tracheal tenderness present. No tracheal deviation present.  Cardiovascular: Normal rate, regular rhythm, normal heart sounds and intact distal pulses.  Pulses:      Radial pulses are 2+ on the right side, and 2+ on the left side.       Dorsalis pedis pulses are 2+ on the right side, and 2+ on the left side.       Posterior tibial pulses are 2+ on the right side, and 2+ on the left side.  Pulmonary/Chest: Effort normal and breath sounds normal. No respiratory distress. She has no decreased breath sounds. She has no wheezes. She exhibits no tenderness, no crepitus and no deformity.  Abdominal: Soft. Bowel sounds are normal. There is no tenderness. There is no rigidity, no rebound, no guarding, no CVA tenderness, no tenderness at McBurney's point and negative Murphy's sign.  Obese  Genitourinary:  Genitourinary Comments: Deferred by patient  Musculoskeletal: Normal range of motion.       Right lower leg: Normal. She exhibits no tenderness and no swelling.       Left lower leg: Normal. She exhibits no tenderness and no swelling.  Feet:  Right Foot:  Protective Sensation: 3 sites tested. 3 sites sensed.  Left Foot:  Protective Sensation: 3 sites tested. 3 sites sensed.  Neurological: She is alert and oriented to person, place, and time. GCS eye subscore is 4. GCS  verbal subscore is 5. GCS motor subscore is 6.  Mental Status: Alert, oriented, thought content appropriate, able to give a coherent history. Speech fluent without evidence of aphasia. Able to follow 2 step commands without difficulty. Cranial Nerves: II: Peripheral visual fields grossly normal, pupils equal, round, reactive to light III,IV, VI: ptosis not present, extra-ocular motions intact bilaterally V,VII: smile symmetric, eyebrows raise symmetric, facial light touch sensation equal VIII: hearing grossly normal to voice X: uvula elevates symmetrically XI: bilateral shoulder shrug symmetric and strong XII: midline tongue extension without fassiculations Motor: Normal tone. 5/5 strength in upper and lower extremities bilaterally including strong and equal grip strength and dorsiflexion/plantar flexion Sensory: Sensation intact to light touch in all extremities.Negative Romberg.  Cerebellar: normal finger-to-nose with bilateral upper extremities. Normal heel-to -shin balance bilaterally of the lower extremity. No pronator drift.  Gait: normal gait and balance CV: distal pulses palpable throughout  Skin: Skin is warm, dry and intact. Capillary refill takes less than 2 seconds. No rash noted.  Psychiatric: She has a normal mood and affect. Her behavior is normal.   ED Treatments / Results  Labs (all labs ordered are listed, but only abnormal results are displayed) Labs Reviewed  BASIC METABOLIC PANEL - Abnormal; Notable for the following components:      Result Value   Potassium 3.3 (*)    Glucose, Bld 142 (*)    Calcium 8.6 (*)    All other components within normal limits  CBC  TROPONIN I    EKG EKG Interpretation  Date/Time:  Thursday December 04 2017 11:36:34 EST Ventricular Rate:  63 PR Interval:    QRS Duration: 90 QT Interval:  416 QTC Calculation: 426 R Axis:   40 Text Interpretation:  Sinus rhythm No STEMI.  Confirmed by Alona Bene [phone]) on 12/04/2017  11:40:37 AM   Radiology Dg Chest 2 View  Result Date: 12/04/2017 CLINICAL DATA:  Weakness, chest pain, dizziness, shortness of breath and cough. EXAM: CHEST - 2 VIEW COMPARISON:  03/30/2017. FINDINGS: Normal sized heart. Clear lungs with normal vascularity. Unremarkable bones. Cholecystectomy clips. IMPRESSION: No acute abnormality. Electronically Signed   By: Viviann Spare  Azucena Kuba M.D.   On: 12/04/2017 12:01    Procedures Procedures (including critical care time)  Medications Ordered in ED Medications  sodium chloride 0.9 % bolus 1,000 mL (1,000 mLs Intravenous New Bag/Given 12/04/17 1212)     Initial Impression / Assessment and Plan / ED Course  I have reviewed the triage vital signs and the nursing notes.  Pertinent labs & imaging results that were available during my care of the patient were reviewed by me and considered in my medical decision making (see chart for details).  Clinical Course as of Dec 05 1315  Thu Dec 04, 2017  1300 Patient reassessed, resting comfortably no acute distress.  States improvement of lightheadedness following 1 L of fluid.  Hypertension has improved, no tachycardia, not hypoxic or tachypneic.  Patient ambulated without difficulty or assistance.   [BM]  1305 Patient seen and evaluated by Dr. Jacqulyn Bath. Advises discharge with PCP follow-up. Encourages rehydration and rest. HeartCare referral as needed. Return precautions discussed at length.   [BM]    Clinical Course User Index [BM] Bill Salinas, PA-C   MIN TUNNELL is a 47 y.o. female who presents to ED 3 days of fatigue, generalized weakness and lightheadedness.  On exam, patient is afebrile, hemodynamically stable with no focal neuro deficits and benign cardiopulmonary exam.  EKG nonacute reviewed by Dr. Jacqulyn Bath. BMP nonacute CBC within normal limits Troponin negative Chest x-ray negative  Patient's lightheadedness and generalized weakness resolved following 1 L of fluid and rest emergency  department.  Patient ambulatory without assistance or difficulty.  Patient with one episode of chest pain/shortness of breath 10 days ago, without recurrence in the past 10 days.  Do not suspect ACS/PE/dissection or emergent diagnoses at this time.  Patient is low risk Wells/PERC negative. Patient with no personal or family history of sudden, young cardiac death. No hx of CHF. No recent complaints of chest pain or shortness of breath.  Pt has remained hemodynamically stable throughout their time in the ED. has been on cardiac monitor during duration of visit without alarm or abnormality.  Afebrile, not tachycardic, not hypotensive, not tachypneic, SPO2 99% on room air.  Resting comfortably no acute distress.  At this time there does not appear to be any evidence of an acute emergency medical condition and the patient appears stable for discharge with appropriate outpatient follow up. Diagnosis was discussed with patient who verbalizes understanding of care plan and is agreeable to discharge. I have discussed return precautions with patient who verbalizes understanding of return precautions. Patient strongly encouraged to follow-up with their PCP. All questions answered.  Patient seen and evaluated by Dr. Jacqulyn Bath who agrees with discharge, PCP follow-up and heart care follow-up as needed.   Note: Portions of this report may have been transcribed using voice recognition software. Every effort was made to ensure accuracy; however, inadvertent computerized transcription errors may still be present.  Final Clinical Impressions(s) / ED Diagnoses   Final diagnoses:  Generalized weakness    ED Discharge Orders    None       Elizabeth Palau 12/04/17 1350    Maia Plan, MD 12/04/17 [phone]

## 2018-02-01 ENCOUNTER — Other Ambulatory Visit: Payer: Self-pay

## 2018-02-01 ENCOUNTER — Emergency Department (HOSPITAL_COMMUNITY)
Admission: EM | Admit: 2018-02-01 | Discharge: 2018-02-01 | Disposition: A | Discharge status: Home / Self Care | Payer: BC Managed Care – PPO | Attending: Emergency Medicine | Admitting: Emergency Medicine

## 2018-02-01 ENCOUNTER — Emergency Department (HOSPITAL_COMMUNITY): Payer: BC Managed Care – PPO

## 2018-02-01 ENCOUNTER — Encounter (HOSPITAL_COMMUNITY): Payer: Self-pay

## 2018-02-01 DIAGNOSIS — J45909 Unspecified asthma, uncomplicated: Secondary | ICD-10-CM | POA: Insufficient documentation

## 2018-02-01 DIAGNOSIS — I10 Essential (primary) hypertension: Secondary | ICD-10-CM | POA: Insufficient documentation

## 2018-02-01 DIAGNOSIS — Z79899 Other long term (current) drug therapy: Secondary | ICD-10-CM | POA: Diagnosis not present

## 2018-02-01 DIAGNOSIS — R51 Headache: Secondary | ICD-10-CM | POA: Diagnosis present

## 2018-02-01 DIAGNOSIS — R519 Headache, unspecified: Secondary | ICD-10-CM

## 2018-02-01 LAB — BASIC METABOLIC PANEL
Anion gap: 8 (ref 5–15)
BUN: 6 mg/dL (ref 6–20)
CALCIUM: 8.7 mg/dL — AB (ref 8.9–10.3)
CO2: 26 mmol/L (ref 22–32)
CREATININE: 0.86 mg/dL (ref 0.44–1.00)
Chloride: 100 mmol/L (ref 98–111)
GFR calc Af Amer: >60 mL/min (ref >60)
GLUCOSE: 139 mg/dL — AB (ref 70–99)
POTASSIUM: 3.7 mmol/L (ref 3.5–5.1)
SODIUM: 134 mmol/L — AB (ref 135–145)

## 2018-02-01 LAB — CBC WITH DIFFERENTIAL/PLATELET
Abs Immature Granulocytes: 0.02 10*3/uL (ref 0.00–0.07)
BASOS PCT: 0 %
Basophils Absolute: 0 10*3/uL (ref 0.0–0.1)
EOS ABS: 0.1 10*3/uL (ref 0.0–0.5)
EOS PCT: 1 %
HCT: 37.3 % (ref 36.0–46.0)
Hemoglobin: 12.3 g/dL (ref 12.0–15.0)
Immature Granulocytes: 0 %
Lymphocytes Relative: 19 %
Lymphs Abs: 1.4 10*3/uL (ref 0.7–4.0)
MCH: 29.1 pg (ref 26.0–34.0)
MCHC: 33 g/dL (ref 30.0–36.0)
MCV: 88.4 fL (ref 80.0–100.0)
MONO ABS: 0.4 10*3/uL (ref 0.1–1.0)
Monocytes Relative: 6 %
Neutro Abs: 5.2 10*3/uL (ref 1.7–7.7)
Neutrophils Relative %: 74 %
PLATELETS: 237 10*3/uL (ref 150–400)
RBC: 4.22 MIL/uL (ref 3.87–5.11)
RDW: 12.5 % (ref 11.5–15.5)
WBC: 7.1 10*3/uL (ref 4.0–10.5)
nRBC: 0 % (ref 0.0–0.2)

## 2018-02-01 MED ORDER — HYDRALAZINE HCL 20 MG/ML IJ SOLN: 5.0000 mg | Freq: Once | INTRAMUSCULAR | Status: DC

## 2018-02-01 MED ORDER — METOCLOPRAMIDE HCL 5 MG/ML IJ SOLN
10.0000 mg | Freq: Once | INTRAMUSCULAR | Status: AC
  Administered 2018-02-01: 10 mg via INTRAVENOUS
  Filled 2018-02-01: qty 2

## 2018-02-01 MED ORDER — MAGNESIUM SULFATE 2 GM/50ML IV SOLN
2.0000 g | Freq: Once | INTRAVENOUS | Status: AC
  Administered 2018-02-01: 2 g via INTRAVENOUS
  Filled 2018-02-01: qty 50

## 2018-02-01 MED ORDER — METHYLPREDNISOLONE SODIUM SUCC 125 MG IJ SOLR
125.0000 mg | Freq: Once | INTRAMUSCULAR | Status: AC
  Administered 2018-02-01: 125 mg via INTRAVENOUS
  Filled 2018-02-01: qty 2

## 2018-02-01 MED ORDER — DIPHENHYDRAMINE HCL 50 MG/ML IJ SOLN
25.0000 mg | Freq: Once | INTRAMUSCULAR | Status: AC
  Administered 2018-02-01: 25 mg via INTRAVENOUS
  Filled 2018-02-01: qty 1

## 2018-02-01 NOTE — ED Notes (Signed)
CT attempted to take Pt twice, Pt states that she needed to use the bathroom first. Pt used beside commode. CT made aware that Pt is ready

## 2018-02-01 NOTE — ED Triage Notes (Signed)
Pt arrives EMS from  Home with c/o "worst headache  Of life". Sudden onset and woke from sleep about 4 am. Pt hx  of migraines. Pt describes "vision going white" last ing about 3 minutes about 1 hour ago now resolved. EMS describe right sided gaze preferences. Pt describes HA as frontal and at base of skull. C/o nausea and given zofran 4mg  iv PTA. Pt took 5 mg bistolic x4 thinking HTN

## 2018-02-01 NOTE — Discharge Instructions (Addendum)
Headache Your lab results showed no acute abnormalities.  For future headaches please try the following regimen: Antiinflammatory medications: Take 600 mg of ibuprofen every 6 hours or 440 mg (over the counter dose) to 500 mg (prescription dose) of naproxen every 12 hours.  Use this regimen until headache subsides for up to 3 days .  After this time, these medications may be used as needed for pain. Take these medications with food to avoid upset stomach. Choose only one of these medications, do not take them together. Tylenol: Should you continue to have additional pain while taking the ibuprofen or naproxen, you may add in tylenol as needed. Your daily total maximum amount of tylenol from all sources should be limited to 4000mg /day for persons without liver problems, or 2000mg /day for those with liver problems.  Hydration: Have a goal of about a half liter of water every couple hours to stay well hydrated.   Sleep: Please be sure to get plenty of sleep with a goal of 8 hours per night. Having a regular bed time and bedtime routine can help with this.  Screens: Reduce the amount of time you are in front of screens.  Take about a 5-10-minute break every hour or every couple hours to give your eyes rest.  Do not use screens in dark rooms.  Glasses with a blue light filter may also help reduce eye fatigue.  Stress: Take steps to reduce stress as much as possible.   Salt: Please reduce salt intake as this is probably what elevated your blood pressure today.  Follow up: Follow-up with your primary care provider on this issue.  May also need to follow-up with the neurologist for increased frequency of headaches.

## 2018-02-01 NOTE — ED Provider Notes (Signed)
[facility] Provider Note   CSN: [id number] Arrival date & time: 02/01/18  1057     History   Chief Complaint Chief Complaint  Patient presents with  . Headache    HPI Destiny Harderesa L Heindel is a 48 y.o. female.  HPI   Destiny Delgado is a 48 y.o. female, with a history of hypertension, morbid obesity, anemia, and asthma, presenting to the ED with headache beginning around 4 AM this morning.  Patient states her headache woke her out of a sleep.  Pain is bilateral frontal, feels like a pressure, 10/10, radiating posteriorly.  Accompanied by nausea.  She states she has a history of migraines and the distribution of this headache is similar to previous migraines, however, it is more severe. Thinking that her headache was due to hypertension, patient took 10 mg Bystolic around 5 AM and then another 10 mg at 8 AM.  This did not change her headache. She admits she has been eating a diet much higher in sodium over the last 24 hours. Denies recent illness, fever, chest pain, shortness of breath, abdominal pain, syncope, vision changes, urinary difficulty, neuro deficits, or any other complaints.    Patient states she was switched from lisinopril to Bystolic about a month ago in an attempt to better control her pressures.  Past Medical History:  Diagnosis Date  . Anemia   . Asthma   . Complication of anesthesia    difficult to awaken  . Head ache   . [facility]   [facility]  . History of chicken pox   . Hypertension   . Obesity   . Renal disorder   . SVD (spontaneous vaginal delivery)    x 4    Patient Active Problem List   Diagnosis Date Noted  . Cervical radiculopathy 02/02/2017  . Hypertension 01/15/2012  . Head ache   . History of chicken pox     Past Surgical History:  Procedure Laterality Date  . ABDOMINAL HYSTERECTOMY    . BILATERAL SALPINGECTOMY Bilateral 03/19/2012   Procedure: BILATERAL  SALPINGECTOMY;  Surgeon: Esmeralda ArthurSandra A Rivard, MD;  Location: [facility];  Service: Gynecology;  Laterality: Bilateral;  robotic assisted   . CHOLECYSTECTOMY    . CYSTOSCOPY W/ RETROGRADES Bilateral 03/19/2012   Procedure: CYSTOSCOPY WITH RETROGRADE PYELOGRAM;  Surgeon: Kathi LudwigSigmund I Tannenbaum, MD;  Location: [facility];  Service: Urology;  Laterality: Bilateral;  with lighted bil stents  . DILITATION & CURRETTAGE/HYSTROSCOPY WITH THERMACHOICE ABLATION  02/26/2012   Procedure: DILATATION & CURETTAGE/HYSTEROSCOPY WITH THERMACHOICE ABLATION;  Surgeon: Michael LitterNaima A Dillard, MD;  Location: [facility];  Service: Gynecology;  Laterality: N/A;  . HYSTEROSCOPY    . ROBOTIC ASSISTED TOTAL HYSTERECTOMY N/A 03/19/2012   Procedure: ROBOTIC ASSISTED TOTAL HYSTERECTOMY;  Surgeon: Esmeralda ArthurSandra A Rivard, MD;  Location: [facility];  Service: Gynecology;  Laterality: N/A;  . TUBAL LIGATION       OB History    Gravida  4   Para  4   Term      Preterm      AB      Living  4     SAB      TAB      Ectopic      Multiple      Live Births               Home Medications    Prior to Admission medications   Medication Sig Start Date  End Date Taking? Authorizing Provider  acetaminophen (TYLENOL) 500 MG tablet Take 1,000 mg by mouth every 6 (six) hours as needed for headache.   Yes [provider]  albuterol (PROVENTIL HFA;VENTOLIN HFA) 108 (90 Base) MCG/ACT inhaler Inhale 1-2 puffs into the lungs every 6 (six) hours as needed for wheezing or shortness of breath.   Yes [provider]  naproxen sodium (ALEVE) 220 MG tablet Take 660 mg by mouth daily as needed (pain).   Yes [provider]  nebivolol (BYSTOLIC) 5 MG tablet Take 10 mg by mouth daily.   Yes [provider]  benzonatate (TESSALON) 100 MG capsule Take 1 capsule (100 mg total) by mouth every 8 (eight) hours. Please try to only take at bed time. Patient not taking: Reported on 02/01/2018 03/13/17   Cristina Gong, PA-C  gabapentin  (NEURONTIN) 100 MG capsule Take 1 capsule (100 mg total) by mouth 3 (three) times daily. Patient not taking: Reported on 02/01/2018 01/31/17   Anson Fret, MD  lisinopril (PRINIVIL,ZESTRIL) 10 MG tablet Take 1 tablet (10 mg total) by mouth daily. Patient not taking: Reported on 02/01/2018 07/08/16   Maxwell Caul, PA-C    Family History Family History  Problem Relation Age of Onset  . Stroke Father   . Hypertension Father   . Diabetes Father   . Heart disease Mother   . Breast cancer Mother   . Thyroid disease Mother   . Anemia Mother   . Diabetes Mother   . Diabetes Brother   . Asthma Daughter   . Diabetes Paternal Aunt   . Diabetes Maternal Aunt   . Diabetes Maternal Uncle   . Diabetes Paternal Uncle     Social History Social History   Tobacco Use  . Smoking status: Never Smoker  . Smokeless tobacco: Never Used  Substance Use Topics  . Alcohol use: Yes    Comment: rare  . Drug use: No     Allergies   Dilaudid [hydromorphone hcl]; Hydrocodone; Ultram [tramadol]; and Codeine   Review of Systems Review of Systems  Constitutional: Negative for chills, diaphoresis and fever.  Eyes: Negative for visual disturbance.  Respiratory: Negative for shortness of breath.   Cardiovascular: Negative for chest pain and leg swelling.  Gastrointestinal: Positive for nausea. Negative for abdominal pain, diarrhea and vomiting.  Neurological: Positive for headaches. Negative for dizziness, syncope, weakness, light-headedness and numbness.  All other systems reviewed and are negative.    Physical Exam Updated Vital Signs BP (!) 182/127 (BP Location: Right Arm)   Pulse 65   Temp 97.9 F (36.6 C) (Oral)   Resp 14   Ht 5\' 11"  (1.803 m)   Wt 136.1 kg   LMP 03/11/2012   SpO2 98%   BMI 41.84 kg/m   Physical Exam Vitals signs and nursing note reviewed.  Constitutional:      General: She is not in acute distress.    Appearance: She is well-developed. She is not  diaphoretic.  HENT:     Head: Normocephalic and atraumatic.     Mouth/Throat:     Mouth: Mucous membranes are moist.     Pharynx: Oropharynx is clear.  Eyes:     Extraocular Movements: Extraocular movements intact.     Conjunctiva/sclera: Conjunctivae normal.     Pupils: Pupils are equal, round, and reactive to light.  Neck:     Musculoskeletal: Normal range of motion and neck supple.  Cardiovascular:     Rate and Rhythm:  Normal rate and regular rhythm.     Pulses: Normal pulses.     Heart sounds: Normal heart sounds.  Pulmonary:     Effort: Pulmonary effort is normal. No respiratory distress.     Breath sounds: Normal breath sounds.  Abdominal:     Palpations: Abdomen is soft.     Tenderness: There is no abdominal tenderness. There is no guarding.  Musculoskeletal:     Right lower leg: No edema.     Left lower leg: No edema.  Lymphadenopathy:     Cervical: No cervical adenopathy.  Skin:    General: Skin is warm and dry.  Neurological:     Mental Status: She is alert.     Comments: Sensation grossly intact to light touch in the extremities. Strength 5/5 in all extremities. No gait disturbance. Coordination intact. Cranial nerves III-XII grossly intact. No facial droop.   Psychiatric:        Behavior: Behavior normal.      ED Treatments / Results  Labs (all labs ordered are listed, but only abnormal results are displayed) Labs Reviewed  BASIC METABOLIC PANEL - Abnormal; Notable for the following components:      Result Value   Sodium 134 (*)    Glucose, Bld 139 (*)    Calcium 8.7 (*)    All other components within normal limits  CBC WITH DIFFERENTIAL/PLATELET    EKG EKG Interpretation  Date/Time:  Sunday February 01 2018 11:03:36 EST Ventricular Rate:  65 PR Interval:    QRS Duration: 88 QT Interval:  436 QTC Calculation: 454 R Axis:   16 Text Interpretation:  Sinus rhythm No significant change since last tracing Confirmed by Gwyneth Sprout ([id number]) on  02/01/2018 12:44:50 PM   Radiology Ct Head Wo Contrast  Result Date: 02/01/2018 CLINICAL DATA:  48 y/o  F; worst headache of life. EXAM: CT HEAD WITHOUT CONTRAST TECHNIQUE: Contiguous axial images were obtained from the base of the skull through the vertex without intravenous contrast. COMPARISON:  11/29/2016 CT head FINDINGS: Brain: No evidence of acute infarction, hemorrhage, hydrocephalus, extra-axial collection or mass lesion/mass effect. Vascular: No hyperdense vessel or unexpected calcification. Skull: Normal. Negative for fracture or focal lesion. Sinuses/Orbits: No acute finding. Other: None. IMPRESSION: No acute intracranial abnormality identified. Stable unremarkable CT of the head. Electronically Signed   By: Mitzi Hansen M.D.   On: 02/01/2018 13:28    Procedures Procedures (including critical care time)  Medications Ordered in ED Medications  metoCLOPramide (REGLAN) injection 10 mg (10 mg Intravenous Given 02/01/18 1209)  diphenhydrAMINE (BENADRYL) injection 25 mg (25 mg Intravenous Given 02/01/18 1206)  methylPREDNISolone sodium succinate (SOLU-MEDROL) 125 mg/2 mL injection 125 mg (125 mg Intravenous Given 02/01/18 1209)  magnesium sulfate IVPB 2 g 50 mL (2 g Intravenous New Bag/Given 02/01/18 1344)     Initial Impression / Assessment and Plan / ED Course  I have reviewed the triage vital signs and the nursing notes.  Pertinent labs & imaging results that were available during my care of the patient were reviewed by me and considered in my medical decision making (see chart for details).  Clinical Course as of Feb 02 1503  Wynelle Link Feb 01, 2018  1445 Patient states she feels much better and would like to go home.   [SJ]    Clinical Course User Index [SJ] Joelys Staubs C, PA-C    Patient presents with headache.  No focal neuro deficits.  No chest pain.  No evidence of renal dysfunction.  Doubt hypertensive emergency.  Since this patient's headache is in the same pattern as her  migraines, I suspect this may primarily be a migraine, but may be worsened by her hypertension.  Suspect her hypertension may be due to her recent high sodium diet. She should follow-up with her PCP for any further assessment. The patient was given instructions for home care as well as return precautions. Patient voices understanding of these instructions, accepts the plan, and is comfortable with discharge.  Findings and plan of care discussed with Gwyneth Sprout, MD.   Vitals:   02/01/18 1145 02/01/18 1230 02/01/18 1342 02/01/18 1503  BP: (!) 189/97 (!) 165/131 (!) 158/94 (!) 150/113  Pulse: (!) 57 62 64 66  Resp: 11 15 18 17   Temp:    97.9 F (36.6 C)  TempSrc:    Oral  SpO2: 98% 98% 99% 95%  Weight:      Height:         Final Clinical Impressions(s) / ED Diagnoses   Final diagnoses:  Frontal headache    ED Discharge Orders    None       Concepcion Living 02/01/18 1508    Gwyneth Sprout, MD 02/02/18 2209

## 2018-02-19 ENCOUNTER — Observation Stay (HOSPITAL_BASED_OUTPATIENT_CLINIC_OR_DEPARTMENT_OTHER)
Admission: EM | Admit: 2018-02-19 | Discharge: 2018-02-20 | Disposition: A | Discharge status: Home / Self Care | Payer: BC Managed Care – PPO | Attending: Family Medicine | Admitting: Family Medicine

## 2018-02-19 ENCOUNTER — Other Ambulatory Visit: Payer: Self-pay

## 2018-02-19 ENCOUNTER — Emergency Department (HOSPITAL_BASED_OUTPATIENT_CLINIC_OR_DEPARTMENT_OTHER): Payer: BC Managed Care – PPO

## 2018-02-19 ENCOUNTER — Encounter (HOSPITAL_BASED_OUTPATIENT_CLINIC_OR_DEPARTMENT_OTHER): Payer: Self-pay | Admitting: *Deleted

## 2018-02-19 DIAGNOSIS — J45909 Unspecified asthma, uncomplicated: Secondary | ICD-10-CM | POA: Insufficient documentation

## 2018-02-19 DIAGNOSIS — I160 Hypertensive urgency: Secondary | ICD-10-CM | POA: Insufficient documentation

## 2018-02-19 DIAGNOSIS — I10 Essential (primary) hypertension: Secondary | ICD-10-CM | POA: Diagnosis not present

## 2018-02-19 DIAGNOSIS — Z6841 Body Mass Index (BMI) 40.0 and over, adult: Secondary | ICD-10-CM | POA: Insufficient documentation

## 2018-02-19 DIAGNOSIS — R079 Chest pain, unspecified: Secondary | ICD-10-CM | POA: Diagnosis not present

## 2018-02-19 DIAGNOSIS — Z79899 Other long term (current) drug therapy: Secondary | ICD-10-CM | POA: Diagnosis not present

## 2018-02-19 DIAGNOSIS — R072 Precordial pain: Secondary | ICD-10-CM

## 2018-02-19 DIAGNOSIS — J4520 Mild intermittent asthma, uncomplicated: Secondary | ICD-10-CM | POA: Diagnosis not present

## 2018-02-19 DIAGNOSIS — R7303 Prediabetes: Secondary | ICD-10-CM

## 2018-02-19 DIAGNOSIS — R0602 Shortness of breath: Secondary | ICD-10-CM | POA: Diagnosis present

## 2018-02-19 DIAGNOSIS — R002 Palpitations: Secondary | ICD-10-CM | POA: Diagnosis present

## 2018-02-19 LAB — URINALYSIS, ROUTINE W REFLEX MICROSCOPIC
BILIRUBIN URINE: NEGATIVE
Glucose, UA: NEGATIVE mg/dL
Hgb urine dipstick: NEGATIVE
KETONES UR: NEGATIVE mg/dL
LEUKOCYTES UA: NEGATIVE
Nitrite: NEGATIVE
PROTEIN: NEGATIVE mg/dL
Specific Gravity, Urine: 1.015 (ref 1.005–1.030)
pH: 6 (ref 5.0–8.0)

## 2018-02-19 LAB — RAPID URINE DRUG SCREEN, HOSP PERFORMED
Amphetamines: NOT DETECTED
Barbiturates: NOT DETECTED
Benzodiazepines: NOT DETECTED
Cocaine: NOT DETECTED
Opiates: NOT DETECTED
Tetrahydrocannabinol: NOT DETECTED

## 2018-02-19 LAB — COMPREHENSIVE METABOLIC PANEL
ALT: 23 U/L (ref 0–44)
AST: 16 U/L (ref 15–41)
Albumin: 3.7 g/dL (ref 3.5–5.0)
Alkaline Phosphatase: 79 U/L (ref 38–126)
Anion gap: 5 (ref 5–15)
BILIRUBIN TOTAL: 0.2 mg/dL — AB (ref 0.3–1.2)
BUN: 14 mg/dL (ref 6–20)
CO2: 28 mmol/L (ref 22–32)
Calcium: 9 mg/dL (ref 8.9–10.3)
Chloride: 101 mmol/L (ref 98–111)
Creatinine, Ser: 0.96 mg/dL (ref 0.44–1.00)
Glucose, Bld: 103 mg/dL — ABNORMAL HIGH (ref 70–99)
POTASSIUM: 3.8 mmol/L (ref 3.5–5.1)
Sodium: 134 mmol/L — ABNORMAL LOW (ref 135–145)
Total Protein: 7 g/dL (ref 6.5–8.1)

## 2018-02-19 LAB — CBC WITH DIFFERENTIAL/PLATELET
Abs Immature Granulocytes: 0.01 10*3/uL (ref 0.00–0.07)
BASOS ABS: 0 10*3/uL (ref 0.0–0.1)
BASOS PCT: 1 %
EOS ABS: 0.2 10*3/uL (ref 0.0–0.5)
Eosinophils Relative: 2 %
HCT: 38.5 % (ref 36.0–46.0)
Hemoglobin: 12.4 g/dL (ref 12.0–15.0)
IMMATURE GRANULOCYTES: 0 %
Lymphocytes Relative: 37 %
Lymphs Abs: 2.9 10*3/uL (ref 0.7–4.0)
MCH: 28.6 pg (ref 26.0–34.0)
MCHC: 32.2 g/dL (ref 30.0–36.0)
MCV: 88.9 fL (ref 80.0–100.0)
MONOS PCT: 9 %
Monocytes Absolute: 0.7 10*3/uL (ref 0.1–1.0)
NRBC: 0 % (ref 0.0–0.2)
Neutro Abs: 4.2 10*3/uL (ref 1.7–7.7)
Neutrophils Relative %: 51 %
PLATELETS: 222 10*3/uL (ref 150–400)
RBC: 4.33 MIL/uL (ref 3.87–5.11)
RDW: 12.4 % (ref 11.5–15.5)
WBC: 8.1 10*3/uL (ref 4.0–10.5)

## 2018-02-19 LAB — LIPASE, BLOOD: LIPASE: 31 U/L (ref 11–51)

## 2018-02-19 LAB — TROPONIN I

## 2018-02-19 LAB — PREGNANCY, URINE: PREG TEST UR: NEGATIVE

## 2018-02-19 MED ORDER — NITROGLYCERIN 2 % TD OINT
1.0000 [in_us] | TOPICAL_OINTMENT | Freq: Four times a day (QID) | TRANSDERMAL | Status: DC
  Administered 2018-02-19 – 2018-02-20 (×2): 1 [in_us] via TOPICAL
  Filled 2018-02-19: qty 30
  Filled 2018-02-19: qty 1

## 2018-02-19 MED ORDER — AMLODIPINE BESYLATE 5 MG PO TABS
5.0000 mg | ORAL_TABLET | Freq: Every day | ORAL | Status: DC
  Administered 2018-02-19 – 2018-02-20 (×2): 5 mg via ORAL
  Filled 2018-02-19 (×2): qty 1

## 2018-02-19 MED ORDER — ACETAMINOPHEN 500 MG PO TABS
1000.0000 mg | ORAL_TABLET | Freq: Once | ORAL | Status: AC
  Administered 2018-02-19: 1000 mg via ORAL
  Filled 2018-02-19: qty 2

## 2018-02-19 MED ORDER — ASPIRIN 81 MG PO CHEW
324.0000 mg | CHEWABLE_TABLET | Freq: Once | ORAL | Status: AC
  Administered 2018-02-19: 324 mg via ORAL
  Filled 2018-02-19: qty 4

## 2018-02-19 MED ORDER — HYDRALAZINE HCL 20 MG/ML IJ SOLN
20.0000 mg | Freq: Once | INTRAMUSCULAR | Status: DC
  Filled 2018-02-19: qty 1

## 2018-02-19 MED ORDER — HYDRALAZINE HCL 20 MG/ML IJ SOLN
10.0000 mg | Freq: Once | INTRAMUSCULAR | Status: AC
  Administered 2018-02-19: 10 mg via INTRAVENOUS
  Filled 2018-02-19: qty 1

## 2018-02-19 MED ORDER — ONDANSETRON HCL 4 MG/2ML IJ SOLN
4.0000 mg | Freq: Three times a day (TID) | INTRAMUSCULAR | Status: DC | PRN
  Administered 2018-02-20: 4 mg via INTRAVENOUS
  Filled 2018-02-19 (×2): qty 2

## 2018-02-19 MED ORDER — HYDRALAZINE HCL 20 MG/ML IJ SOLN
5.0000 mg | INTRAMUSCULAR | Status: DC | PRN
  Administered 2018-02-20: 5 mg via INTRAVENOUS
  Filled 2018-02-19: qty 1

## 2018-02-19 NOTE — H&P (Signed)
History and Physical    Destiny Delgado ZOX:[id number] DOB: 1970/08/28 DOA: 02/19/2018  Referring MD/NP/PA:   PCP: Patient, No Pcp Per   Patient coming from:  The patient is coming from home.  At baseline, pt is independent for most of ADL.        Chief Complaint: CP and palpitation  HPI: Destiny Delgado is a 48 y.o. female with medical history significant of hypertension, asthma, anemia, morbid obesity, who presents with palpitation and chest pain.    Pt states that she started having chest pain and palpitations since last night.  The chest pain is located in the substernal area, intermittent, nonradiating, dull, initially 8 out of 10 in severity, currently chest pain-free.  Patient has shortness of breath, and mild dry cough, no fever or chills.  She did not have long distant traveling recently.  No tenderness in the calf areas.  Patient states that her blood pressure is elevated.  She used to be on 3 Bp medications, including Bystolic, lisinopril and Cozaar, which was recently adjusted. Currently patient is taking Cozaar 100 mg daily only. Her Bp is 180/117 in ED. Patient was treated with IV hydralazine 10 mg and nitroglycerin patch in ED. Blood pressure improved to 151/100.  Patient has nausea, no vomiting, diarrhea or abdominal pain.  No symptoms of UTI or unilateral weakness.  She states that she has mild bilateral ankle edema recently.  ED Course: pt was found to have negative troponin, lipase is 31, negative urinalysis, electrolytes renal function okay, temperature normal, bradycardia, RR 22, oxygen saturation 99% on room air, chest x-ray negative.  Patient is placed on telemetry bed of observation.   Review of Systems:   General: no fevers, chills, has fatigue HEENT: no blurry vision, hearing changes or sore throat Respiratory: has dyspnea, coughing, no wheezing CV: has chest pain and palpitations GI: has nausea, no vomiting, abdominal pain, diarrhea, constipation GU: no dysuria,  burning on urination, increased urinary frequency, hematuria  Ext: has ankle edema Neuro: no unilateral weakness, numbness, or tingling, no vision change or hearing loss Skin: no rash, no skin tear. MSK: No muscle spasm, no deformity, no limitation of range of movement in spin Heme: No easy bruising.  Travel history: No recent long distant travel.  Allergy:  Allergies  Allergen Reactions  . Dilaudid [Hydromorphone Hcl] Itching and Rash    Itching and rash Pt reports taking dilaudid at home Dr. Estanislado Pandyivard aware of allergy.  . Hydrocodone Nausea And Vomiting    Patient will not take this  . Ultram [Tramadol] Itching and Nausea And Vomiting  . Codeine Itching and Rash    Past Medical History:  Diagnosis Date  . Anemia   . Asthma   . Complication of anesthesia    difficult to awaken  . Head ache   . [facility]   [facility]  . History of chicken pox   . Hypertension   . Obesity   . Renal disorder   . SVD (spontaneous vaginal delivery)    x 4    Past Surgical History:  Procedure Laterality Date  . ABDOMINAL HYSTERECTOMY    . BILATERAL SALPINGECTOMY Bilateral 03/19/2012   Procedure: BILATERAL SALPINGECTOMY;  Surgeon: Esmeralda ArthurSandra A Rivard, MD;  Location: [facility];  Service: Gynecology;  Laterality: Bilateral;  robotic assisted   . CHOLECYSTECTOMY    . CYSTOSCOPY W/ RETROGRADES Bilateral 03/19/2012   Procedure: CYSTOSCOPY WITH RETROGRADE PYELOGRAM;  Surgeon: Kathi LudwigSigmund I Tannenbaum, MD;  Location:  [facility];  Service: Urology;  Laterality: Bilateral;  with lighted bil stents  . DILITATION & CURRETTAGE/HYSTROSCOPY WITH THERMACHOICE ABLATION  02/26/2012   Procedure: DILATATION & CURETTAGE/HYSTEROSCOPY WITH THERMACHOICE ABLATION;  Surgeon: Michael LitterNaima A Dillard, MD;  Location: [facility];  Service: Gynecology;  Laterality: N/A;  . HYSTEROSCOPY    . ROBOTIC ASSISTED TOTAL HYSTERECTOMY N/A 03/19/2012   Procedure: ROBOTIC ASSISTED TOTAL HYSTERECTOMY;  Surgeon: Esmeralda ArthurSandra A Rivard, MD;   Location: [facility];  Service: Gynecology;  Laterality: N/A;  . TUBAL LIGATION      Social History:  reports that she has never smoked. She has never used smokeless tobacco. She reports current alcohol use. She reports that she does not use drugs.  Family History:  Family History  Problem Relation Age of Onset  . Stroke Father   . Hypertension Father   . Diabetes Father   . Heart disease Mother   . Breast cancer Mother   . Thyroid disease Mother   . Anemia Mother   . Diabetes Mother   . Diabetes Brother   . Asthma Daughter   . Diabetes Paternal Aunt   . Diabetes Maternal Aunt   . Diabetes Maternal Uncle   . Diabetes Paternal Uncle      Prior to Admission medications   Medication Sig Start Date End Date Taking? Authorizing Provider  acetaminophen (TYLENOL) 500 MG tablet Take 1,000 mg by mouth every 6 (six) hours as needed for headache.    [provider]  albuterol (PROVENTIL HFA;VENTOLIN HFA) 108 (90 Base) MCG/ACT inhaler Inhale 1-2 puffs into the lungs every 6 (six) hours as needed for wheezing or shortness of breath.    [provider]  benzonatate (TESSALON) 100 MG capsule Take 1 capsule (100 mg total) by mouth every 8 (eight) hours. Please try to only take at bed time. Patient not taking: Reported on 02/01/2018 03/13/17   Cristina GongHammond, Elizabeth W, PA-C  gabapentin (NEURONTIN) 100 MG capsule Take 1 capsule (100 mg total) by mouth 3 (three) times daily. Patient not taking: Reported on 02/01/2018 01/31/17   Anson FretAhern, Antonia B, MD  lisinopril (PRINIVIL,ZESTRIL) 10 MG tablet Take 1 tablet (10 mg total) by mouth daily. Patient not taking: Reported on 02/01/2018 07/08/16   Graciella FreerLayden, Lindsey A, PA-C  losartan (COZAAR) 100 MG tablet Take 100 mg by mouth daily. 12/16/17   [provider]  naproxen sodium (ALEVE) 220 MG tablet Take 660 mg by mouth daily as needed (pain).    [provider]  nebivolol (BYSTOLIC) 5 MG tablet Take 10 mg by mouth daily.    [provider]    Physical Exam: Vitals:   02/19/18 2311 02/19/18 2322 02/20/18 0107 02/20/18 0311  BP:  (!) 151/100  124/77  Pulse:    62  Resp:      Temp:      TempSrc:      SpO2:   98%   Weight: (!) 142.9 kg     Height: 5\' 11"  (1.803 m)      General: Not in acute distress HEENT:       Eyes: PERRL, EOMI, no scleral icterus.       ENT: No discharge from the ears and nose, no pharynx injection, no tonsillar enlargement.        Neck: No JVD, no bruit, no mass felt. Heme: No neck lymph node enlargement. Cardiac: S1/S2, RRR, No murmurs, No gallops or rubs. Respiratory: No rales, wheezing, rhonchi or rubs. GI: Soft, nondistended, nontender, no rebound pain,  no organomegaly, BS present. GU: No hematuria Ext: has trace ankle edema bilaterally. 2+DP/PT pulse bilaterally. Musculoskeletal: No joint deformities, No joint redness or warmth, no limitation of ROM in spin. Skin: No rashes.  Neuro: Alert, oriented X3, cranial nerves II-XII grossly intact, moves all extremities normally.  Psych: Patient is not psychotic, no suicidal or hemocidal ideation.  Labs on Admission: I have personally reviewed following labs and imaging studies  CBC: Recent Labs  Lab 02/19/18 1940  WBC 8.1  NEUTROABS 4.2  HGB 12.4  HCT 38.5  MCV 88.9  PLT 222   Basic Metabolic Panel: Recent Labs  Lab 02/19/18 1940  NA 134*  K 3.8  CL 101  CO2 28  GLUCOSE 103*  BUN 14  CREATININE 0.96  CALCIUM 9.0   GFR: Estimated Creatinine Clearance: 113.9 mL/min (by C-G formula based on SCr of 0.96 mg/dL). Liver Function Tests: Recent Labs  Lab 02/19/18 1940  AST 16  ALT 23  ALKPHOS 79  BILITOT 0.2*  PROT 7.0  ALBUMIN 3.7   Recent Labs  Lab 02/19/18 1940  LIPASE 31   No results for input(s): AMMONIA in the last 168 hours. Coagulation Profile: No results for input(s): INR, PROTIME in the last 168 hours. Cardiac Enzymes: Recent Labs  Lab 02/19/18 1940 02/20/18 0028  TROPONINI <0.03 <0.03    BNP (last 3 results) No results for input(s): PROBNP in the last 8760 hours. HbA1C: No results for input(s): HGBA1C in the last 72 hours. CBG: No results for input(s): GLUCAP in the last 168 hours. Lipid Profile: No results for input(s): CHOL, HDL, LDLCALC, TRIG, CHOLHDL, LDLDIRECT in the last 72 hours. Thyroid Function Tests: Recent Labs    02/20/18 0028  TSH 3.484   Anemia Panel: No results for input(s): VITAMINB12, FOLATE, FERRITIN, TIBC, IRON, RETICCTPCT in the last 72 hours. Urine analysis:    Component Value Date/Time   COLORURINE YELLOW 02/19/2018 1940   APPEARANCEUR CLEAR 02/19/2018 1940   LABSPEC 1.015 02/19/2018 1940   PHURINE 6.0 02/19/2018 1940   GLUCOSEU NEGATIVE 02/19/2018 1940   HGBUR NEGATIVE 02/19/2018 1940   BILIRUBINUR NEGATIVE 02/19/2018 1940   BILIRUBINUR neg 02/07/2012 0923   KETONESUR NEGATIVE 02/19/2018 1940   PROTEINUR NEGATIVE 02/19/2018 1940   UROBILINOGEN negative 02/07/2012 0923   NITRITE NEGATIVE 02/19/2018 1940   LEUKOCYTESUR NEGATIVE 02/19/2018 1940   Sepsis Labs: @LABRCNTIP (procalcitonin:4,lacticidven:4) )No results found for this or any previous visit (from the past 240 hour(s)).   Radiological Exams on Admission: Dg Chest 2 View  Result Date: 02/19/2018 CLINICAL DATA:  Chest pain EXAM: CHEST - 2 VIEW COMPARISON:  12/04/2017 FINDINGS: The heart size and mediastinal contours are within normal limits. Both lungs are clear. The visualized skeletal structures are unremarkable. IMPRESSION: No active cardiopulmonary disease. Electronically Signed   By: Tollie Eth M.D.   On: 02/19/2018 20:33     EKG: Independently reviewed.  Sinus rhythm, QTC 442, LAE, Q waves in inferior leads, nonspecific T wave change.  Assessment/Plan Principal Problem:   Chest pain Active Problems:   Hypertension   Hypertensive urgency   Asthma   Obesities, morbid with BMI 43.9   Chest pain: Patient's chest pain is likely due to demand ischemia secondary to  hypertensive urgency.  Chest pain improved along with improved blood pressure control.  Currently patient is chest pain-free.  Initial troponin negative.  - will place on Tele bed for obs - cycle CE q6 x3 and repeat EKG in the am  - prn Nitroglycerin, Morphine, and  aspirin - Risk factor stratification: will check FLP and A1C, UDS - did not order 2d echo--> please reevaluate pt in AM to decide if pt needs 2D echo.  Hypertension and Hypertensive urgency: Bp was up to 180/117. Pt will need two meds -continue home Cozaar -Add amlodipine 5 mg daily -IV hydralazine PRN  Asthma: Stable.  No wheezing or rhonchi on auscultation. - Atrovent nebulizer, and as needed albuterol nebulizer -PRN Mucinex for cough  Obesities, morbid with BMI 43.9: -Diet and exercise.   -Encouraged to lose weight.  -pt may need sleep study   DVT ppx: SQ Lovenox Code Status: Full code Family Communication: None at bed side.    Disposition Plan:  Anticipate discharge back to previous home environment Consults called: none  Admission status: Obs / tele   Date of Service 02/20/2018    Lorretta Harp [facility]   If 7PM-7AM, please contact night-coverage [email or web address] Password [email or web address] 02/20/2018, 4:03 AM

## 2018-02-19 NOTE — ED Notes (Signed)
Pt denies chest pain

## 2018-02-19 NOTE — ED Provider Notes (Signed)
[facility] EMERGENCY DEPARTMENT Provider Note   CSN: [id number] Arrival date & time: 02/19/18  1800     History   Chief Complaint Chief Complaint  Patient presents with  . Hypertension    HPI Destiny Delgado is a 48 y.o. female.  HPI She reports that she was in the emergency department the beginning of the month with headache and high blood pressure.  Reports she felt better after treatment.  She reports she had been doing fairly well at home.  She reports however now over the past number of nights she is waking up with chest pressure like somebody sitting on her.  He thinks that her blood pressure is going up in the night or early morning hours.  She does not have a machine at home to measure her pressure.  Patient reports she was on by systolic which was discontinued.  She now takes Cozaar 100 mg regularly.  Denies missed doses.  No nausea vomiting or abdominal pain.  No fever no chills no cough.  No lower extremity swelling or calf pain. Past Medical History:  Diagnosis Date  . Anemia   . Asthma   . Complication of anesthesia    difficult to awaken  . Head ache   . History of blood transfusion 01/2012   at [facility]  . History of chicken pox   . Hypertension   . Obesity   . Renal disorder   . SVD (spontaneous vaginal delivery)    x 4    Patient Active Problem List   Diagnosis Date Noted  . Chest pain 02/19/2018  . Hypertensive urgency 02/19/2018  . Cervical radiculopathy 02/02/2017  . Hypertension 01/15/2012  . Head ache   . History of chicken pox     Past Surgical History:  Procedure Laterality Date  . ABDOMINAL HYSTERECTOMY    . BILATERAL SALPINGECTOMY Bilateral 03/19/2012   Procedure: BILATERAL SALPINGECTOMY;  Surgeon: Esmeralda Arthur, MD;  Location: [facility];  Service: Gynecology;  Laterality: Bilateral;  robotic assisted   . CHOLECYSTECTOMY    . CYSTOSCOPY W/ RETROGRADES Bilateral 03/19/2012   Procedure: CYSTOSCOPY WITH RETROGRADE PYELOGRAM;   Surgeon: Kathi Ludwig, MD;  Location: [facility];  Service: Urology;  Laterality: Bilateral;  with lighted bil stents  . DILITATION & CURRETTAGE/HYSTROSCOPY WITH THERMACHOICE ABLATION  02/26/2012   Procedure: DILATATION & CURETTAGE/HYSTEROSCOPY WITH THERMACHOICE ABLATION;  Surgeon: Michael Litter, MD;  Location: [facility];  Service: Gynecology;  Laterality: N/A;  . HYSTEROSCOPY    . ROBOTIC ASSISTED TOTAL HYSTERECTOMY N/A 03/19/2012   Procedure: ROBOTIC ASSISTED TOTAL HYSTERECTOMY;  Surgeon: Esmeralda Arthur, MD;  Location: [facility];  Service: Gynecology;  Laterality: N/A;  . TUBAL LIGATION       OB History    Gravida  4   Para  4   Term      Preterm      AB      Living  4     SAB      TAB      Ectopic      Multiple      Live Births               Home Medications    Prior to Admission medications   Medication Sig Start Date End Date Taking? Authorizing Provider  acetaminophen (TYLENOL) 500 MG tablet Take 1,000 mg by mouth every 6 (six) hours as needed for headache.    [provider]  albuterol (PROVENTIL HFA;VENTOLIN  HFA) 108 (90 Base) MCG/ACT inhaler Inhale 1-2 puffs into the lungs every 6 (six) hours as needed for wheezing or shortness of breath.    [provider]  benzonatate (TESSALON) 100 MG capsule Take 1 capsule (100 mg total) by mouth every 8 (eight) hours. Please try to only take at bed time. Patient not taking: Reported on 02/01/2018 03/13/17   Cristina GongHammond, Elizabeth W, PA-C  gabapentin (NEURONTIN) 100 MG capsule Take 1 capsule (100 mg total) by mouth 3 (three) times daily. Patient not taking: Reported on 02/01/2018 01/31/17   Anson FretAhern, Antonia B, MD  lisinopril (PRINIVIL,ZESTRIL) 10 MG tablet Take 1 tablet (10 mg total) by mouth daily. Patient not taking: Reported on 02/01/2018 07/08/16   Graciella FreerLayden, Lindsey A, PA-C  losartan (COZAAR) 100 MG tablet Take 100 mg by mouth daily. 12/16/17   [provider]  naproxen sodium (ALEVE) 220 MG tablet Take 660  mg by mouth daily as needed (pain).    [provider]  nebivolol (BYSTOLIC) 5 MG tablet Take 10 mg by mouth daily.    [provider]    Family History Family History  Problem Relation Age of Onset  . Stroke Father   . Hypertension Father   . Diabetes Father   . Heart disease Mother   . Breast cancer Mother   . Thyroid disease Mother   . Anemia Mother   . Diabetes Mother   . Diabetes Brother   . Asthma Daughter   . Diabetes Paternal Aunt   . Diabetes Maternal Aunt   . Diabetes Maternal Uncle   . Diabetes Paternal Uncle     Social History Social History   Tobacco Use  . Smoking status: Never Smoker  . Smokeless tobacco: Never Used  Substance Use Topics  . Alcohol use: Yes    Comment: rare  . Drug use: No     Allergies   Dilaudid [hydromorphone hcl]; Hydrocodone; Ultram [tramadol]; and Codeine   Review of Systems Review of Systems 10 Systems reviewed and are negative for acute change except as noted in the HPI.   Physical Exam Updated Vital Signs BP (!) 150/107   Pulse 71   Temp 98.4 F (36.9 C) (Oral)   Resp (!) 23   Ht 5\' 11"  (1.803 m)   Wt 136.1 kg   LMP 03/11/2012   SpO2 99%   BMI 41.84 kg/m   Physical Exam Constitutional:      Comments: Patient is alert and nontoxic.  No respiratory distress.  Morbid obesity.  HENT:     Head: Normocephalic and atraumatic.     Mouth/Throat:     Mouth: Mucous membranes are moist.  Eyes:     Extraocular Movements: Extraocular movements intact.     Conjunctiva/sclera: Conjunctivae normal.     Pupils: Pupils are equal, round, and reactive to light.  Neck:     Musculoskeletal: Neck supple.  Cardiovascular:     Rate and Rhythm: Normal rate and regular rhythm.     Pulses: Normal pulses.     Heart sounds: Normal heart sounds.  Pulmonary:     Effort: Pulmonary effort is normal.     Breath sounds: Normal breath sounds.  Abdominal:     General: There is no distension.     Palpations: Abdomen  is soft.     Tenderness: There is no abdominal tenderness. There is no guarding.  Musculoskeletal: Normal range of motion.        General: No swelling or tenderness.  Right lower leg: No edema.     Left lower leg: No edema.  Skin:    General: Skin is warm and dry.  Neurological:     General: No focal deficit present.     Mental Status: She is oriented to person, place, and time.     Coordination: Coordination normal.     Gait: Gait normal.  Psychiatric:        Mood and Affect: Mood normal.      ED Treatments / Results  Labs (all labs ordered are listed, but only abnormal results are displayed) Labs Reviewed  COMPREHENSIVE METABOLIC PANEL - Abnormal; Notable for the following components:      Result Value   Sodium 134 (*)    Glucose, Bld 103 (*)    Total Bilirubin 0.2 (*)    All other components within normal limits  LIPASE, BLOOD  TROPONIN I  CBC WITH DIFFERENTIAL/PLATELET  URINALYSIS, ROUTINE W REFLEX MICROSCOPIC  RAPID URINE DRUG SCREEN, HOSP PERFORMED  PREGNANCY, URINE    EKG EKG Interpretation  Date/Time:  Thursday February 19 2018 18:17:42 EST Ventricular Rate:  73 PR Interval:  146 QRS Duration: 92 QT Interval:  402 QTC Calculation: 442 R Axis:   38 Text Interpretation:  Normal sinus rhythm Normal ECG no sig change from old Confirmed by Arby Barrette [phone]) on 02/19/2018 6:44:55 PM   Radiology Dg Chest 2 View  Result Date: 02/19/2018 CLINICAL DATA:  Chest pain EXAM: CHEST - 2 VIEW COMPARISON:  12/04/2017 FINDINGS: The heart size and mediastinal contours are within normal limits. Both lungs are clear. The visualized skeletal structures are unremarkable. IMPRESSION: No active cardiopulmonary disease. Electronically Signed   By: Tollie Eth M.D.   On: 02/19/2018 20:33    Procedures Procedures (including critical care time) CRITICAL CARE Performed by: Arby Barrette   Total critical care time:30 minutes  Critical care time was exclusive of  separately billable procedures and treating other patients.  Critical care was necessary to treat or prevent imminent or life-threatening deterioration.  Critical care was time spent personally by me on the following activities: development of treatment plan with patient and/or surrogate as well as nursing, discussions with consultants, evaluation of patient's response to treatment, examination of patient, obtaining history from patient or surrogate, ordering and performing treatments and interventions, ordering and review of laboratory studies, ordering and review of radiographic studies, pulse oximetry and re-evaluation of patient's condition. Medications Ordered in ED Medications  nitroGLYCERIN (NITROGLYN) 2 % ointment 1 inch (1 inch Topical Given 02/19/18 1942)  hydrALAZINE (APRESOLINE) injection 20 mg (0 mg Intravenous Hold 02/19/18 2127)  amLODipine (NORVASC) tablet 5 mg (has no administration in time range)  hydrALAZINE (APRESOLINE) injection 5 mg (has no administration in time range)  ondansetron (ZOFRAN) injection 4 mg (has no administration in time range)  acetaminophen (TYLENOL) tablet 1,000 mg (has no administration in time range)  aspirin chewable tablet 324 mg (324 mg Oral Given 02/19/18 1939)  hydrALAZINE (APRESOLINE) injection 10 mg (10 mg Intravenous Given 02/19/18 2056)     Initial Impression / Assessment and Plan / ED Course  I have reviewed the triage vital signs and the nursing notes.  Pertinent labs & imaging results that were available during my care of the patient were reviewed by me and considered in my medical decision making (see chart for details).    Consult: Reviewed with Dr. Clyde Lundborg who accepts for admission.  Patient presents as outlined.  She is hypertensive.  Patient has been having  chest pain in the mornings.  Concern for hypertensive urgency.  Patient is given aspirin and Nitropaste.  She is been given as needed doses of hydralazine.  Blood pressures responded to  the 150s/100.  Patient does not have active chest pain.  Plan for admission for hypertension and rule out MI.    Final Clinical Impressions(s) / ED Diagnoses   Final diagnoses:  Hypertensive urgency  Precordial pain    ED Discharge Orders    None       Arby BarrettePfeiffer, Alisson Rozell, MD 02/19/18 2131

## 2018-02-19 NOTE — Care Management (Signed)
This is a no charge note  Transfer from [facility] per Dr.    48 yo F with hx of hypertension, asthma, anemia, obesity, who presents with palpitation and chest pain.  Found to have elevated blood pressure 180/117.  Patient was treated with 2 dose of IV hydralazine 10 and 20 mg, and nitroglycerin patch, blood pressure and chest pain improved.    Patient was found to have negative troponin, lipase 31, negative urinalysis, electrolytes renal function okay, temperature normal, bradycardia, RR 22, oxygen saturation 99% on room air, chest x-ray negative.  Patient is placed on telemetry bed of observation.   Please call manager of [facility] at [phone] when pt arrives to floor   Lorretta Harp, MD  [facility]   If 7PM-7AM, please contact night-coverage [email or web address] Password [facility] 02/19/2018, 9:27 PM

## 2018-02-19 NOTE — ED Triage Notes (Signed)
Pt states that she has a history of high BP, she has been having palpitations and not feeling well for the past 2 weeks.

## 2018-02-19 NOTE — ED Notes (Signed)
[facility] notified (Tammy) - patient ready for transport 

## 2018-02-19 NOTE — ED Notes (Signed)
Patient transported to X-ray 

## 2018-02-20 DIAGNOSIS — J4520 Mild intermittent asthma, uncomplicated: Secondary | ICD-10-CM | POA: Diagnosis not present

## 2018-02-20 DIAGNOSIS — R079 Chest pain, unspecified: Secondary | ICD-10-CM

## 2018-02-20 DIAGNOSIS — I10 Essential (primary) hypertension: Secondary | ICD-10-CM

## 2018-02-20 DIAGNOSIS — I160 Hypertensive urgency: Secondary | ICD-10-CM | POA: Diagnosis not present

## 2018-02-20 DIAGNOSIS — J45909 Unspecified asthma, uncomplicated: Secondary | ICD-10-CM | POA: Diagnosis not present

## 2018-02-20 LAB — TROPONIN I
Troponin I: <0.03 ng/mL (ref <0.03)
Troponin I: <0.03 ng/mL (ref <0.03)
Troponin I: <0.03 ng/mL (ref <0.03)

## 2018-02-20 LAB — TSH: TSH: 3.484 u[IU]/mL (ref 0.350–4.500)

## 2018-02-20 LAB — LIPID PANEL
Cholesterol: 169 mg/dL (ref 0–200)
HDL: 44 mg/dL (ref >40)
LDL CALC: 94 mg/dL (ref 0–99)
TRIGLYCERIDES: 153 mg/dL — AB (ref <150)
Total CHOL/HDL Ratio: 3.8 RATIO
VLDL: 31 mg/dL (ref 0–40)

## 2018-02-20 LAB — BRAIN NATRIURETIC PEPTIDE: B Natriuretic Peptide: 10.8 pg/mL (ref 0.0–100.0)

## 2018-02-20 LAB — HEMOGLOBIN A1C
Hgb A1c MFr Bld: 6.2 % — ABNORMAL HIGH (ref 4.8–5.6)
Mean Plasma Glucose: 131.24 mg/dL

## 2018-02-20 MED ORDER — NAPROXEN SODIUM 220 MG PO TABS: 440.0000 mg | ORAL_TABLET | Freq: Every day | ORAL | Status: AC | PRN

## 2018-02-20 MED ORDER — ASPIRIN 81 MG PO CHEW
324.0000 mg | CHEWABLE_TABLET | Freq: Every day | ORAL | Status: DC
  Administered 2018-02-20: 324 mg via ORAL
  Filled 2018-02-20: qty 4

## 2018-02-20 MED ORDER — MORPHINE SULFATE (PF) 2 MG/ML IV SOLN
2.0000 mg | INTRAVENOUS | Status: DC | PRN
  Administered 2018-02-20: 2 mg via INTRAVENOUS
  Filled 2018-02-20: qty 1

## 2018-02-20 MED ORDER — ALPRAZOLAM 0.25 MG PO TABS
0.2500 mg | ORAL_TABLET | Freq: Two times a day (BID) | ORAL | Status: DC | PRN
  Administered 2018-02-20: 0.25 mg via ORAL
  Filled 2018-02-20: qty 1

## 2018-02-20 MED ORDER — HYDRALAZINE HCL 25 MG PO TABS: 25.0000 mg | ORAL_TABLET | Freq: Three times a day (TID) | ORAL | 0 refills | Status: AC

## 2018-02-20 MED ORDER — AMLODIPINE BESYLATE 10 MG PO TABS: 10.0000 mg | ORAL_TABLET | Freq: Every day | ORAL | 0 refills | Status: AC

## 2018-02-20 MED ORDER — ENOXAPARIN SODIUM 80 MG/0.8ML ~~LOC~~ SOLN: 70.0000 mg | Freq: Every day | SUBCUTANEOUS | Status: DC

## 2018-02-20 MED ORDER — NITROGLYCERIN 0.4 MG SL SUBL: 0.4000 mg | SUBLINGUAL_TABLET | SUBLINGUAL | Status: DC | PRN

## 2018-02-20 MED ORDER — NAPROXEN SODIUM 275 MG PO TABS
550.0000 mg | ORAL_TABLET | Freq: Every day | ORAL | Status: DC | PRN
  Administered 2018-02-20: 550 mg via ORAL
  Filled 2018-02-20 (×3): qty 2

## 2018-02-20 MED ORDER — DM-GUAIFENESIN ER 30-600 MG PO TB12: 1.0000 | ORAL_TABLET | Freq: Two times a day (BID) | ORAL | Status: DC | PRN

## 2018-02-20 MED ORDER — LOSARTAN POTASSIUM 50 MG PO TABS
100.0000 mg | ORAL_TABLET | Freq: Every day | ORAL | Status: DC
  Administered 2018-02-20: 100 mg via ORAL
  Filled 2018-02-20 (×2): qty 2

## 2018-02-20 MED ORDER — ENOXAPARIN SODIUM 40 MG/0.4ML ~~LOC~~ SOLN
40.0000 mg | Freq: Every day | SUBCUTANEOUS | Status: DC
  Administered 2018-02-20: 40 mg via SUBCUTANEOUS
  Filled 2018-02-20: qty 0.4

## 2018-02-20 MED ORDER — IPRATROPIUM BROMIDE 0.02 % IN SOLN
0.5000 mg | Freq: Four times a day (QID) | RESPIRATORY_TRACT | Status: DC
  Administered 2018-02-20: 0.5 mg via RESPIRATORY_TRACT
  Filled 2018-02-20: qty 2.5

## 2018-02-20 MED ORDER — ALBUTEROL SULFATE (2.5 MG/3ML) 0.083% IN NEBU: 2.5000 mg | INHALATION_SOLUTION | RESPIRATORY_TRACT | Status: DC | PRN

## 2018-02-20 MED ORDER — ZOLPIDEM TARTRATE 5 MG PO TABS: 5.0000 mg | ORAL_TABLET | Freq: Every evening | ORAL | Status: DC | PRN

## 2018-02-20 MED ORDER — HYDRALAZINE HCL 25 MG PO TABS
25.0000 mg | ORAL_TABLET | Freq: Once | ORAL | Status: AC
  Administered 2018-02-20: 25 mg via ORAL
  Filled 2018-02-20: qty 1

## 2018-02-20 MED ORDER — AMLODIPINE BESYLATE 5 MG PO TABS
5.0000 mg | ORAL_TABLET | Freq: Once | ORAL | Status: AC
  Administered 2018-02-20: 5 mg via ORAL
  Filled 2018-02-20: qty 1

## 2018-02-20 NOTE — Discharge Summary (Signed)
Physician Discharge Summary  Destiny Harderesa L Salmi XBJ:[id number]:[id number] DOB: 04/24/1970 DOA: 02/19/2018  PCP: Dr. Hal Hopeichter  Admit date: 02/19/2018 Discharge date: 02/20/2018  Admitted From: Home Disposition: Home   Recommendations for Outpatient Follow-up:  1. Follow up with PCP in the next week for hypertension management 2. HbA1c 6.2%, diet counseling briefly provided, will need continued attention at follow up.  Home Health: None Equipment/Devices: None Discharge Condition: Stable CODE STATUS: Full Diet recommendation: Heart healthy, carb-modified  Brief/Interim Summary: Destiny Delgado is a 48 y.o. female with medical history significant of hypertension, asthma, anemia, morbid obesity, who presents with palpitation and chest pain.    Pt states that she started having chest pain and palpitations since last night.  The chest pain is located in the substernal area, intermittent, nonradiating, dull, initially 8 out of 10 in severity, currently chest pain-free.  Patient has shortness of breath, and mild dry cough, no fever or chills.  She did not have long distant traveling recently.  No tenderness in the calf areas.  Patient states that her blood pressure is elevated.  She used to be on 3 Bp medications, including Bystolic, lisinopril and Cozaar, which was recently adjusted. Currently patient is taking Cozaar 100 mg daily only. Her Bp is 180/117 in ED. Patient was treated with IV hydralazine 10 mg and nitroglycerin patch in ED. Blood pressure improved to 151/100.  Patient has nausea, no vomiting, diarrhea or abdominal pain.  No symptoms of UTI or unilateral weakness.  She states that she has mild bilateral ankle edema recently.  ED Course: pt was found to have negative troponin, lipase is 31, negative urinalysis, electrolytes renal function okay, temperature normal, bradycardia, RR 22, oxygen saturation 99% on room air, chest x-ray negative.  Patient was placed on telemetry bed of observation.    Discharge Diagnoses:  Principal Problem:   Chest pain Active Problems:   Hypertension   Hypertensive urgency   Asthma   Obesities, morbid with BMI 43.9  Chest pain: Patient's chest pain is likely due to demand ischemia secondary to hypertensive urgency, resolved with better control. Troponins negative.  - Consider outpatient risk stratification.   Hypertension and hypertensive urgency: Urgency resolved. Plan to continue improvement in BP over next few days to avoid drop in CPP.  - Continue losartan, added low dose norvasc and hydralazine with improvement in BP without overly aggressive lowering. Will continue medications as below and advise she needs close PCP follow up and return precautions addressed.   Asthma: Stable.  No wheezing or rhonchi on auscultation. - continue home medications  Obesity: BMI 43. Weight loss recommended.  - Consider sleep study as outpatient  Discharge Instructions Discharge Instructions    Diet - low sodium heart healthy   Complete by:  As directed    Diet Carb Modified   Complete by:  As directed    Discharge instructions   Complete by:  As directed    You were admitted for evaluation of chest pain which was due to uncontrolled high blood pressure. This has improved with changing of medications. You are stable for discharge with the following recommendations:  - Start taking norvasc 10mg  daily (first dose tomorrow morning) - Start taking hydralazine 25mg  three times daily (first dose this afternoon) - Continue taking losartan - Follow up with your PCP very soon for continued management of hypertension. If your symptoms worsen, seek medical attention sooner. - Your hemoglobin A1c was 6.2% here. This indicates you are at risk of developing diabetes, so it  is recommended that you decrease carbohydrates in the diet, maintain an active lifestyle, and discuss this with your primary doctor.   Increase activity slowly   Complete by:  As directed       Allergies as of 02/20/2018      Reactions   Dilaudid [hydromorphone Hcl] Itching, Rash   Itching and rash Pt reports taking dilaudid at home Dr. Estanislado Pandy aware of allergy.   Hydrocodone Nausea And Vomiting   Patient will not take this   Ultram [tramadol] Itching, Nausea And Vomiting   Codeine Itching, Rash      Medication List    STOP taking these medications   lisinopril 10 MG tablet Commonly known as:  PRINIVIL,ZESTRIL     TAKE these medications   acetaminophen 500 MG tablet Commonly known as:  TYLENOL Take 1,000 mg by mouth every 6 (six) hours as needed for headache.   albuterol 108 (90 Base) MCG/ACT inhaler Commonly known as:  PROVENTIL HFA;VENTOLIN HFA Inhale 1-2 puffs into the lungs every 6 (six) hours as needed for wheezing or shortness of breath.   amLODipine 10 MG tablet Commonly known as:  NORVASC Take 1 tablet (10 mg total) by mouth daily.   benzonatate 100 MG capsule Commonly known as:  TESSALON Take 1 capsule (100 mg total) by mouth every 8 (eight) hours. Please try to only take at bed time.   gabapentin 100 MG capsule Commonly known as:  NEURONTIN Take 1 capsule (100 mg total) by mouth 3 (three) times daily.   hydrALAZINE 25 MG tablet Commonly known as:  APRESOLINE Take 1 tablet (25 mg total) by mouth 3 (three) times daily for 30 days.   losartan 100 MG tablet Commonly known as:  COZAAR Take 100 mg by mouth daily.   naproxen sodium 220 MG tablet Commonly known as:  ALEVE Take 2 tablets (440 mg total) by mouth daily as needed (pain). What changed:  how much to take      Follow-up Information    Dois Davenport, MD. Schedule an appointment as soon as possible for a visit in 1 week(s).   Specialty:  Family Medicine Contact information: [address] [place] [address] [place] [address] [phone]          Allergies  Allergen Reactions  . Dilaudid [Hydromorphone Hcl] Itching and Rash    Itching and rash Pt reports taking dilaudid at  home Dr. Estanislado Pandy aware of allergy.  . Hydrocodone Nausea And Vomiting    Patient will not take this  . Ultram [Tramadol] Itching and Nausea And Vomiting  . Codeine Itching and Rash   Consultations:  None  Procedures/Studies: Dg Chest 2 View  Result Date: 02/19/2018 CLINICAL DATA:  Chest pain EXAM: CHEST - 2 VIEW COMPARISON:  12/04/2017 FINDINGS: The heart size and mediastinal contours are within normal limits. Both lungs are clear. The visualized skeletal structures are unremarkable. IMPRESSION: No active cardiopulmonary disease. Electronically Signed   By: Tollie Eth M.D.   On: 02/19/2018 20:33   Ct Head Wo Contrast  Result Date: 02/01/2018 CLINICAL DATA:  48 y/o  F; worst headache of life. EXAM: CT HEAD WITHOUT CONTRAST TECHNIQUE: Contiguous axial images were obtained from the base of the skull through the vertex without intravenous contrast. COMPARISON:  11/29/2016 CT head FINDINGS: Brain: No evidence of acute infarction, hemorrhage, hydrocephalus, extra-axial collection or mass lesion/mass effect. Vascular: No hyperdense vessel or unexpected calcification. Skull: Normal. Negative for fracture or focal lesion. Sinuses/Orbits: No acute finding. Other: None.  IMPRESSION: No acute intracranial abnormality identified. Stable unremarkable CT of the head. Electronically Signed   By: Mitzi Hansen M.D.   On: 02/01/2018 13:28     Subjective: Developed headache with nitropaste, improved on reevaluation. No visual changes, neurological deficits. Chest pain resolved. Feels nauseated.   Discharge Exam: Vitals:   02/20/18 0900 02/20/18 1200  BP: (!) 172/99 132/89  Pulse:    Resp:    Temp:    SpO2:     General: Pt is alert, awake, not in acute distress Cardiovascular: RRR, S1/S2 +, no rubs, no gallops Respiratory: CTA bilaterally, no wheezing, no rhonchi Abdominal: Soft, NT, ND, bowel sounds + Extremities: No edema, no cyanosis  Labs: BNP (last 3 results) Recent Labs     02/20/18 0028  BNP 10.8   Basic Metabolic Panel: Recent Labs  Lab 02/19/18 1940  NA 134*  K 3.8  CL 101  CO2 28  GLUCOSE 103*  BUN 14  CREATININE 0.96  CALCIUM 9.0   Liver Function Tests: Recent Labs  Lab 02/19/18 1940  AST 16  ALT 23  ALKPHOS 79  BILITOT 0.2*  PROT 7.0  ALBUMIN 3.7   Recent Labs  Lab 02/19/18 1940  LIPASE 31   No results for input(s): AMMONIA in the last 168 hours. CBC: Recent Labs  Lab 02/19/18 1940  WBC 8.1  NEUTROABS 4.2  HGB 12.4  HCT 38.5  MCV 88.9  PLT 222   Cardiac Enzymes: Recent Labs  Lab 02/19/18 1940 02/20/18 0028 02/20/18 0534 02/20/18 1224  TROPONINI <0.03 <0.03 <0.03 <0.03   BNP: Invalid input(s): POCBNP CBG: No results for input(s): GLUCAP in the last 168 hours. D-Dimer No results for input(s): DDIMER in the last 72 hours. Hgb A1c No results for input(s): HGBA1C in the last 72 hours. Lipid Profile No results for input(s): CHOL, HDL, LDLCALC, TRIG, CHOLHDL, LDLDIRECT in the last 72 hours. Thyroid function studies No results for input(s): TSH, T4TOTAL, T3FREE, THYROIDAB in the last 72 hours.  Invalid input(s): FREET3 Anemia work up No results for input(s): VITAMINB12, FOLATE, FERRITIN, TIBC, IRON, RETICCTPCT in the last 72 hours. Urinalysis    Component Value Date/Time   COLORURINE YELLOW 02/19/2018 1940   APPEARANCEUR CLEAR 02/19/2018 1940   LABSPEC 1.015 02/19/2018 1940   PHURINE 6.0 02/19/2018 1940   GLUCOSEU NEGATIVE 02/19/2018 1940   HGBUR NEGATIVE 02/19/2018 1940   BILIRUBINUR NEGATIVE 02/19/2018 1940   BILIRUBINUR neg 02/07/2012 0923   KETONESUR NEGATIVE 02/19/2018 1940   PROTEINUR NEGATIVE 02/19/2018 1940   UROBILINOGEN negative 02/07/2012 0923   NITRITE NEGATIVE 02/19/2018 1940   LEUKOCYTESUR NEGATIVE 02/19/2018 1940    Microbiology No results found for this or any previous visit (from the past 240 hour(s)).  Time coordinating discharge: Approximately 40 minutes  Tyrone Nine,  MD  [facility] 02/24/2018, 7:31 PM Pager [phone]

## 2018-02-21 LAB — HIV ANTIBODY (ROUTINE TESTING W REFLEX): HIV Screen 4th Generation wRfx: NONREACTIVE

## 2018-02-25 ENCOUNTER — Encounter (HOSPITAL_BASED_OUTPATIENT_CLINIC_OR_DEPARTMENT_OTHER): Payer: Self-pay

## 2018-02-25 ENCOUNTER — Other Ambulatory Visit: Payer: Self-pay

## 2018-02-25 ENCOUNTER — Emergency Department (HOSPITAL_BASED_OUTPATIENT_CLINIC_OR_DEPARTMENT_OTHER)
Admission: EM | Admit: 2018-02-25 | Discharge: 2018-02-25 | Disposition: A | Discharge status: Home / Self Care | Payer: BC Managed Care – PPO | Attending: Emergency Medicine | Admitting: Emergency Medicine

## 2018-02-25 DIAGNOSIS — I10 Essential (primary) hypertension: Secondary | ICD-10-CM | POA: Diagnosis not present

## 2018-02-25 DIAGNOSIS — J45909 Unspecified asthma, uncomplicated: Secondary | ICD-10-CM | POA: Diagnosis not present

## 2018-02-25 DIAGNOSIS — R519 Headache, unspecified: Secondary | ICD-10-CM

## 2018-02-25 DIAGNOSIS — J111 Influenza due to unidentified influenza virus with other respiratory manifestations: Secondary | ICD-10-CM

## 2018-02-25 DIAGNOSIS — R69 Illness, unspecified: Secondary | ICD-10-CM

## 2018-02-25 DIAGNOSIS — R51 Headache: Secondary | ICD-10-CM | POA: Diagnosis not present

## 2018-02-25 MED ORDER — FLUTICASONE PROPIONATE 50 MCG/ACT NA SUSP: 1.0000 | Freq: Every day | NASAL | 2 refills | Status: AC

## 2018-02-25 MED ORDER — ACETAMINOPHEN 325 MG PO TABS
650.0000 mg | ORAL_TABLET | Freq: Once | ORAL | Status: DC
  Filled 2018-02-25: qty 2

## 2018-02-25 MED ORDER — IBUPROFEN 400 MG PO TABS
600.0000 mg | ORAL_TABLET | Freq: Once | ORAL | Status: AC
  Administered 2018-02-25: 600 mg via ORAL
  Filled 2018-02-25: qty 1

## 2018-02-25 MED ORDER — ALBUTEROL SULFATE HFA 108 (90 BASE) MCG/ACT IN AERS: 1.0000 | INHALATION_SPRAY | Freq: Four times a day (QID) | RESPIRATORY_TRACT | 0 refills | Status: AC | PRN

## 2018-02-25 MED ORDER — DIPHENHYDRAMINE HCL 50 MG/ML IJ SOLN
25.0000 mg | Freq: Once | INTRAMUSCULAR | Status: AC
  Administered 2018-02-25: 25 mg via INTRAVENOUS
  Filled 2018-02-25: qty 1

## 2018-02-25 MED ORDER — METOCLOPRAMIDE HCL 5 MG/ML IJ SOLN
10.0000 mg | Freq: Once | INTRAMUSCULAR | Status: AC
  Administered 2018-02-25: 10 mg via INTRAVENOUS
  Filled 2018-02-25: qty 2

## 2018-02-25 MED ORDER — OSELTAMIVIR PHOSPHATE 75 MG PO CAPS: 75.0000 mg | ORAL_CAPSULE | Freq: Two times a day (BID) | ORAL | 0 refills | Status: AC

## 2018-02-25 MED ORDER — SODIUM CHLORIDE 0.9 % IV BOLUS
1000.0000 mL | Freq: Once | INTRAVENOUS | Status: AC
  Administered 2018-02-25: 1000 mL via INTRAVENOUS

## 2018-02-25 MED ORDER — BENZONATATE 100 MG PO CAPS: 200.0000 mg | ORAL_CAPSULE | Freq: Three times a day (TID) | ORAL | 0 refills | Status: AC

## 2018-02-25 NOTE — Discharge Instructions (Addendum)
Take the following medications to help with your symptoms. Please continue taking your blood pressure medications as this will also help with your headache. Alternating Tylenol and ibuprofen will help with bodyaches and fever. Return to ED for worsening symptoms, worsening headache, blurry vision, numbness in arms or legs, shortness of breath.

## 2018-02-25 NOTE — ED Triage Notes (Signed)
C/o HA x today-states "feels like my blood pressure"-states she had recent admn and PCP visit with changes to BP meds-NAD- to triage in w/c-drove self to ED

## 2018-02-25 NOTE — ED Provider Notes (Signed)
[facility] EMERGENCY DEPARTMENT Provider Note   CSN: [id number] Arrival date & time: 02/25/18  1610     History   Chief Complaint Chief Complaint  Patient presents with  . Headache    HPI MARJARIE IRION is a 48 y.o. female with a past medical history of HTN, migraines, obesity, who presents to ED for headache that began when she woke up this morning.  She reports associated wheezing, dry cough, sinus pain and pressure, rhinorrhea and chills.  She was admitted from 02/19/2018 to 02/20/2018 hypertensive emergency.  She was discharged home with 3 antihypertensives.  She followed up with her PCP 2 days ago and was again switched to different antihypertensives.  She is currently on labetalol and losartan.  She is scheduled to begin losartan HCTZ tomorrow as prescribed by PCP.  She took 2 Aleve with only mild improvement in her headache.  She did not receive her influenza vaccine this year.  No sick contacts with similar symptoms.  Denies any vision changes, vomiting, injuries or falls, numbness in arms or legs, shortness of breath or recent travel.  HPI  Past Medical History:  Diagnosis Date  . Anemia   . Asthma   . Complication of anesthesia    difficult to awaken  . Head ache   . History of blood transfusion 01/2012   at [facility]  . History of chicken pox   . Hypertension   . Obesity   . Renal disorder   . SVD (spontaneous vaginal delivery)    x 4    Patient Active Problem List   Diagnosis Date Noted  . Asthma 02/20/2018  . Obesities, morbid with BMI 43.9 02/20/2018  . Chest pain 02/19/2018  . Hypertensive urgency 02/19/2018  . Cervical radiculopathy 02/02/2017  . Hypertension 01/15/2012  . Head ache   . History of chicken pox     Past Surgical History:  Procedure Laterality Date  . ABDOMINAL HYSTERECTOMY    . BILATERAL SALPINGECTOMY Bilateral 03/19/2012   Procedure: BILATERAL SALPINGECTOMY;  Surgeon: Esmeralda Arthur, MD;  Location: [facility];  Service:  Gynecology;  Laterality: Bilateral;  robotic assisted   . CHOLECYSTECTOMY    . CYSTOSCOPY W/ RETROGRADES Bilateral 03/19/2012   Procedure: CYSTOSCOPY WITH RETROGRADE PYELOGRAM;  Surgeon: Kathi Ludwig, MD;  Location: [facility];  Service: Urology;  Laterality: Bilateral;  with lighted bil stents  . DILITATION & CURRETTAGE/HYSTROSCOPY WITH THERMACHOICE ABLATION  02/26/2012   Procedure: DILATATION & CURETTAGE/HYSTEROSCOPY WITH THERMACHOICE ABLATION;  Surgeon: Michael Litter, MD;  Location: [facility];  Service: Gynecology;  Laterality: N/A;  . HYSTEROSCOPY    . ROBOTIC ASSISTED TOTAL HYSTERECTOMY N/A 03/19/2012   Procedure: ROBOTIC ASSISTED TOTAL HYSTERECTOMY;  Surgeon: Esmeralda Arthur, MD;  Location: [facility];  Service: Gynecology;  Laterality: N/A;  . TUBAL LIGATION       OB History    Gravida  4   Para  4   Term      Preterm      AB      Living  4     SAB      TAB      Ectopic      Multiple      Live Births               Home Medications    Prior to Admission medications   Medication Sig Start Date End Date Taking? Authorizing Provider  acetaminophen (TYLENOL) 500 MG tablet Take 1,000 mg by  mouth every 6 (six) hours as needed for headache.   Yes [provider]  amLODipine (NORVASC) 10 MG tablet Take 1 tablet (10 mg total) by mouth daily. 02/21/18  Yes Tyrone Nine, MD  hydrALAZINE (APRESOLINE) 25 MG tablet Take 1 tablet (25 mg total) by mouth 3 (three) times daily for 30 days. 02/20/18 03/22/18 Yes Tyrone Nine, MD  losartan (COZAAR) 100 MG tablet Take 100 mg by mouth daily. 12/16/17  Yes [provider]  naproxen sodium (ALEVE) 220 MG tablet Take 2 tablets (440 mg total) by mouth daily as needed (pain). 02/20/18  Yes Tyrone Nine, MD  albuterol (PROVENTIL HFA;VENTOLIN HFA) 108 (90 Base) MCG/ACT inhaler Inhale 1-2 puffs into the lungs every 6 (six) hours as needed for wheezing or shortness of breath. 02/25/18   Clevland Cork, PA-C  benzonatate  (TESSALON) 100 MG capsule Take 2 capsules (200 mg total) by mouth every 8 (eight) hours. 02/25/18   Donelda Mailhot, PA-C  fluticasone (FLONASE) 50 MCG/ACT nasal spray Place 1 spray into both nostrils daily. 02/25/18   Mali Eppard, PA-C  gabapentin (NEURONTIN) 100 MG capsule Take 1 capsule (100 mg total) by mouth 3 (three) times daily. Patient not taking: Reported on 02/01/2018 01/31/17   Anson Fret, MD  oseltamivir (TAMIFLU) 75 MG capsule Take 1 capsule (75 mg total) by mouth every 12 (twelve) hours. 02/25/18   Dietrich Pates, PA-C    Family History Family History  Problem Relation Age of Onset  . Stroke Father   . Hypertension Father   . Diabetes Father   . Heart disease Mother   . Breast cancer Mother   . Thyroid disease Mother   . Anemia Mother   . Diabetes Mother   . Diabetes Brother   . Asthma Daughter   . Diabetes Paternal Aunt   . Diabetes Maternal Aunt   . Diabetes Maternal Uncle   . Diabetes Paternal Uncle     Social History Social History   Tobacco Use  . Smoking status: Never Smoker  . Smokeless tobacco: Never Used  Substance Use Topics  . Alcohol use: Yes    Comment: rare  . Drug use: No     Allergies   Dilaudid [hydromorphone hcl]; Hydrocodone; Ultram [tramadol]; and Codeine   Review of Systems Review of Systems  Constitutional: Negative for appetite change, chills and fever.  HENT: Positive for congestion, sinus pressure and sinus pain. Negative for ear pain, rhinorrhea, sneezing and sore throat.   Eyes: Negative for photophobia and visual disturbance.  Respiratory: Positive for wheezing. Negative for cough, chest tightness and shortness of breath.   Cardiovascular: Negative for chest pain and palpitations.  Gastrointestinal: Positive for nausea. Negative for abdominal pain, blood in stool, constipation, diarrhea and vomiting.  Genitourinary: Negative for dysuria, hematuria and urgency.  Musculoskeletal: Positive for myalgias.  Skin: Negative for rash.    Neurological: Positive for headaches. Negative for dizziness, weakness and light-headedness.     Physical Exam Updated Vital Signs BP (!) 139/92 (BP Location: Left Arm)   Pulse 97   Temp 97.9 F (36.6 C)   Resp 20   Ht 5\' 11"  (1.803 m)   Wt (!) 143.3 kg   LMP 03/11/2012   SpO2 98%   BMI 44.07 kg/m   Physical Exam Vitals signs and nursing note reviewed.  Constitutional:      General: She is not in acute distress.    Appearance: She is well-developed.  HENT:     Head:  Normocephalic and atraumatic.     Right Ear: A middle ear effusion is present.     Left Ear: A middle ear effusion is present.     Nose:     Right Sinus: Frontal sinus tenderness present.     Left Sinus: Frontal sinus tenderness present.     Mouth/Throat:     Pharynx: Oropharynx is clear. Uvula midline.  Eyes:     General: No scleral icterus.       Right eye: No discharge.        Left eye: No discharge.     Conjunctiva/sclera: Conjunctivae normal.     Pupils: Pupils are equal, round, and reactive to light.  Neck:     Musculoskeletal: Normal range of motion and neck supple.     Comments: No meningismus. Cardiovascular:     Rate and Rhythm: Normal rate and regular rhythm.     Heart sounds: Normal heart sounds. No murmur. No friction rub. No gallop.   Pulmonary:     Effort: Pulmonary effort is normal. No respiratory distress.     Breath sounds: Normal breath sounds.  Abdominal:     General: Bowel sounds are normal. There is no distension.     Palpations: Abdomen is soft.     Tenderness: There is no abdominal tenderness. There is no guarding.  Musculoskeletal: Normal range of motion.  Skin:    General: Skin is warm and dry.     Findings: No rash.  Neurological:     General: No focal deficit present.     Mental Status: She is alert and oriented to person, place, and time.     Cranial Nerves: No cranial nerve deficit.     Sensory: No sensory deficit.     Motor: No weakness or abnormal muscle tone.      Coordination: Coordination normal.      ED Treatments / Results  Labs (all labs ordered are listed, but only abnormal results are displayed) Labs Reviewed - No data to display  EKG None  Radiology No results found.  Procedures Procedures (including critical care time)  Medications Ordered in ED Medications  metoCLOPramide (REGLAN) injection 10 mg (10 mg Intravenous Given 02/25/18 1713)  diphenhydrAMINE (BENADRYL) injection 25 mg (25 mg Intravenous Given 02/25/18 1713)  sodium chloride 0.9 % bolus 1,000 mL (0 mLs Intravenous Stopped 02/25/18 1809)  ibuprofen (ADVIL,MOTRIN) tablet 600 mg (600 mg Oral Given 02/25/18 1947)     Initial Impression / Assessment and Plan / ED Course  I have reviewed the triage vital signs and the nursing notes.  Pertinent labs & imaging results that were available during my care of the patient were reviewed by me and considered in my medical decision making (see chart for details).  Clinical Course as of Feb 26 2019  Wed Feb 25, 2018  1919 Temp(!): 100.7 F (38.2 C) [HK]  1919 Patient was not given Tylenol as originally ordered. Will administer and reassess.   [HK]  2020 Temp 99.7 F oral on my recheck and requesting discharge home.   [HK]    Clinical Course User Index [HK] Dietrich PatesKhatri, Lenette Rau, PA-C    Patient with symptoms consistent with influenza like illness.  Vitals are stable, low-grade fever.  No signs of dehydration, tolerating PO's.  Lungs are clear.  I feel that her headache is more likely related to sinusitis.  She is hypertensive here but she has significant improvement with migraine cocktail and fluids given.  Discussed the cost versus benefit of  Tamiflu treatment with the patient.  Discussed risks versus benefits of Tamiflu administration as she is still in the 24 to 48-hour window of treatment.  She elects to be treated with Tamiflu and symptomatic treatment.  Patient will be discharged with instructions to orally hydrate, rest, and use  over-the-counter medications such as anti-inflammatories ibuprofen and Aleve for muscle aches and Tylenol for fever.  Patient will also be given a cough suppressant.  There are no headache characteristics that are lateralizing or concerning for increased ICP, infectious or vascular cause of her symptoms.  I feel as though her fever is from influenza like illness. No deficits on neurological exam noted.  Will advise her to continue antihypertensives as prescribed by PCP and return to ED for any severe worsening symptoms.  Patient is hemodynamically stable, in NAD, and able to ambulate in the ED. Evaluation does not show pathology that would require ongoing emergent intervention or inpatient treatment. I explained the diagnosis to the patient. Pain has been managed and has no complaints prior to discharge. Patient is comfortable with above plan and is stable for discharge at this time. All questions were answered prior to disposition. Strict return precautions for returning to the ED were discussed. Encouraged follow up with PCP.    Portions of this note were generated with Scientist, clinical (histocompatibility and immunogenetics)Dragon dictation software. Dictation errors may occur despite best attempts at proofreading.    Final Clinical Impressions(s) / ED Diagnoses   Final diagnoses:  Sinus headache  Influenza-like illness    ED Discharge Orders         Ordered    oseltamivir (TAMIFLU) 75 MG capsule  Every 12 hours     02/25/18 1910    fluticasone (FLONASE) 50 MCG/ACT nasal spray  Daily     02/25/18 1910    benzonatate (TESSALON) 100 MG capsule  Every 8 hours     02/25/18 1910    albuterol (PROVENTIL HFA;VENTOLIN HFA) 108 (90 Base) MCG/ACT inhaler  Every 6 hours PRN     02/25/18 1910           Dietrich PatesKhatri, Sereen Schaff, PA-C 02/25/18 2021    Gwyneth SproutPlunkett, Whitney, MD 02/25/18 2334

## 2019-01-08 IMAGING — CT CT HEAD W/O CM
3 series · 14 of 47 positions shown, 16 images · non-contrast
Comparison: 11/29/2016 CT head

CLINICAL DATA: 47 y/o  F; worst headache of life.

EXAM:
CT HEAD WITHOUT CONTRAST
TECHNIQUE: Contiguous axial images were obtained from the base of the skull
through the vertex without intravenous contrast.

[Series 3: head 5.0 h30s · axial · 0.44mm/px · z∈[-99,+36]mm · 8 of 33 slices shown, 10 images]
[im 3/33  brain]
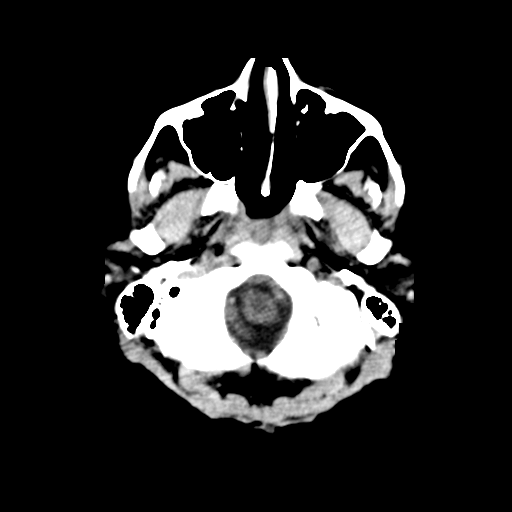
[im 3/33  bone]
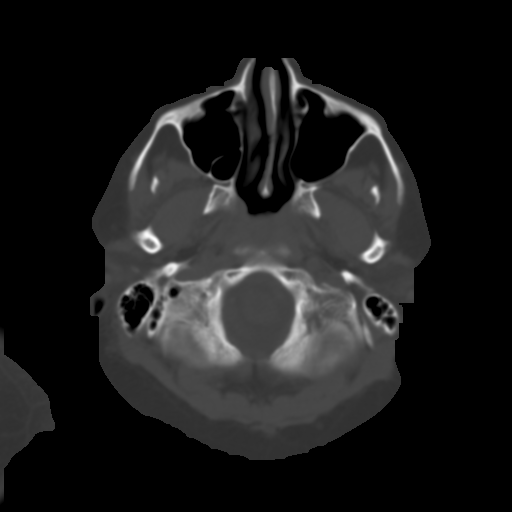
[im 7/33  brain]
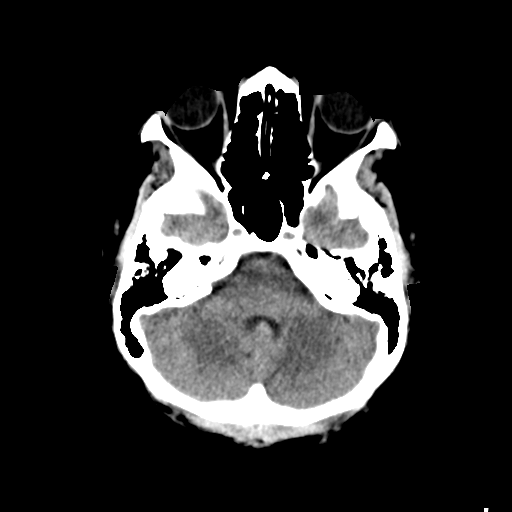
[im 10/33  brain]
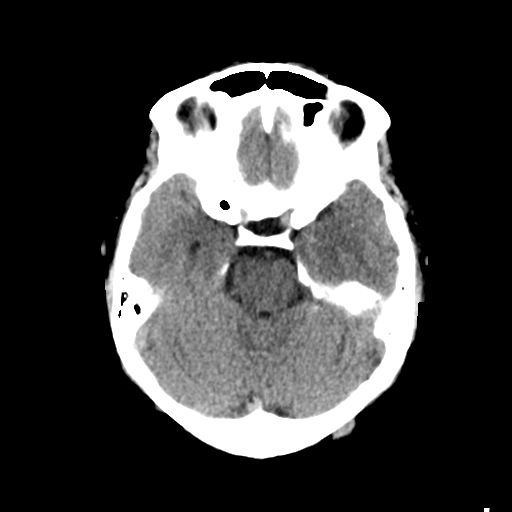
[im 15/33  brain]
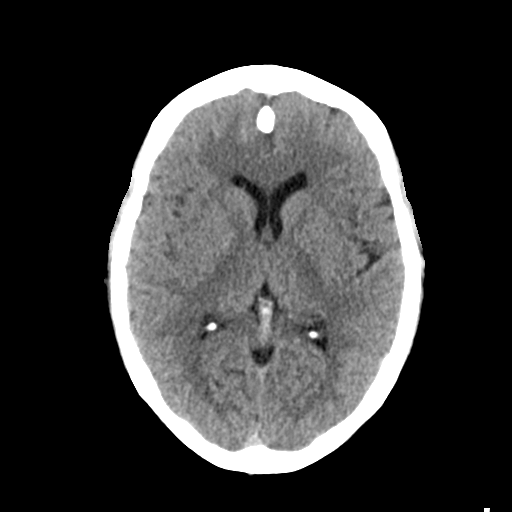
[im 18/33  brain]
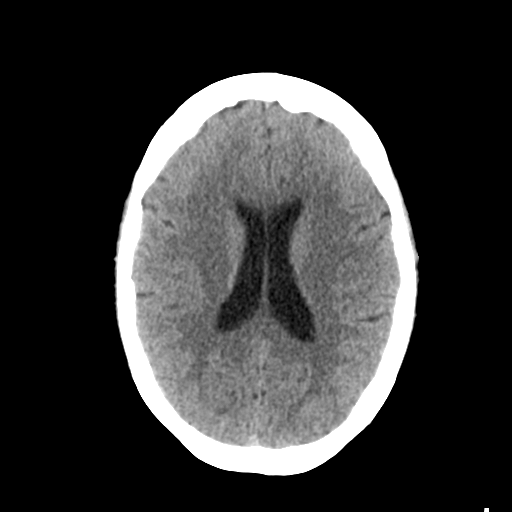
[im 18/33  bone]
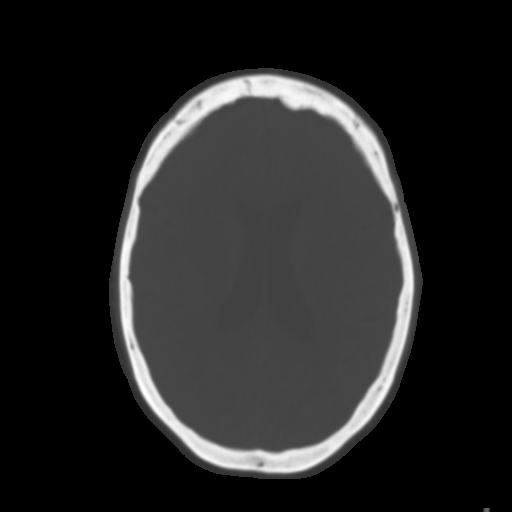
[im 23/33  brain]
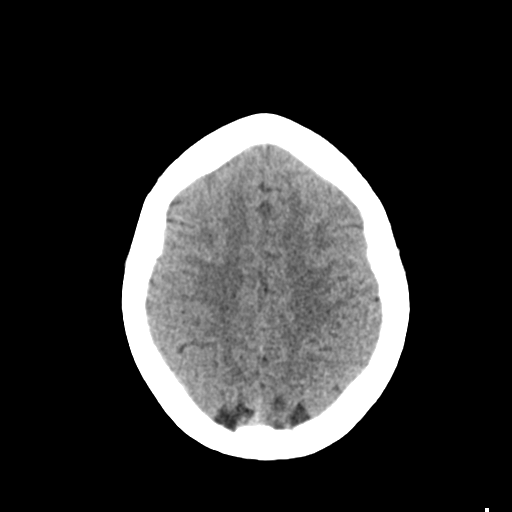
[im 26/33  brain]
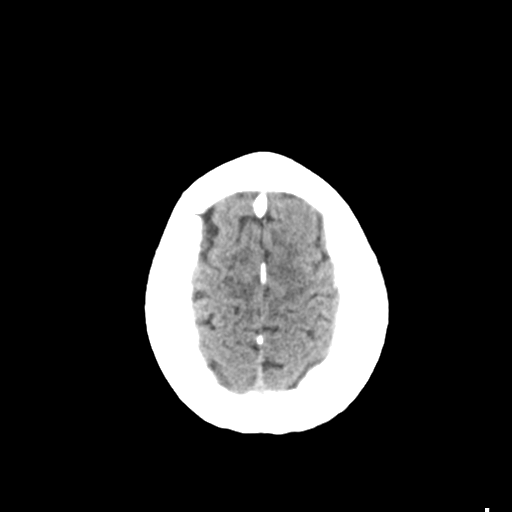
[im 30/33  brain]
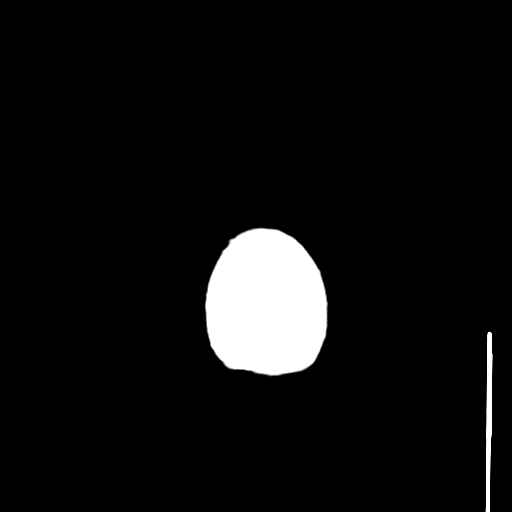

[Series 5: head 3.0 mpr cor · coronal · 0.32mm/px · 3 of 69 slices shown]
[im 23/69  brain]
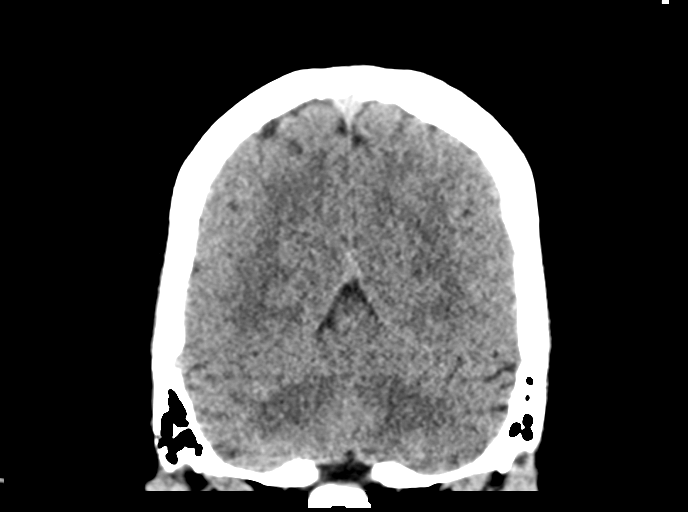
[im 31/69  brain]
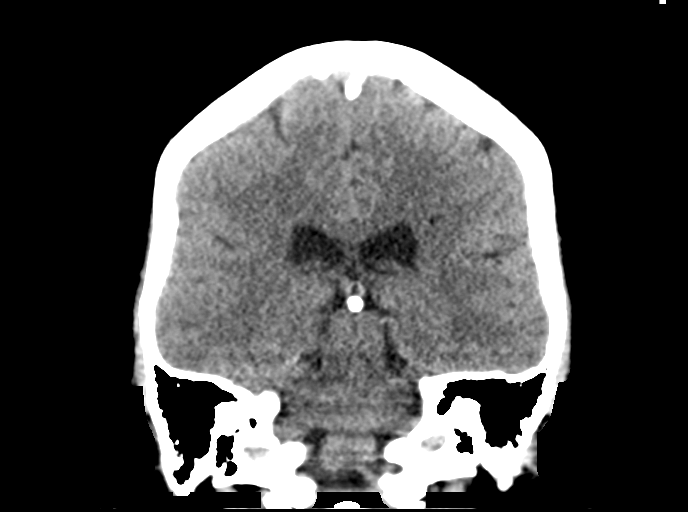
[im 38/69  brain]
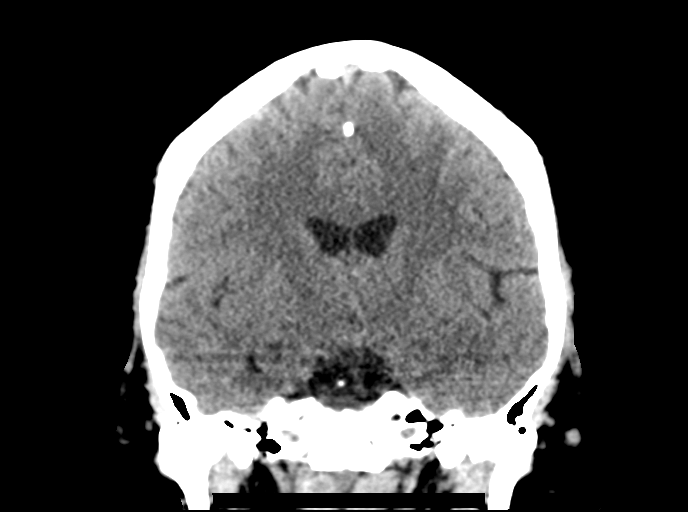

[Series 6: head 3.0 mpr sag · sagittal · 0.32mm/px · 3 of 67 slices shown]
[im 23/67  brain]
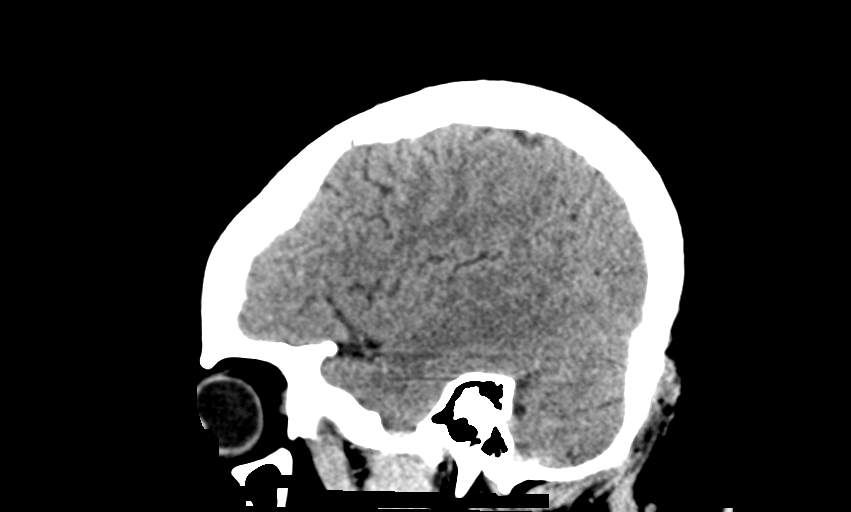
[im 34/67  brain]
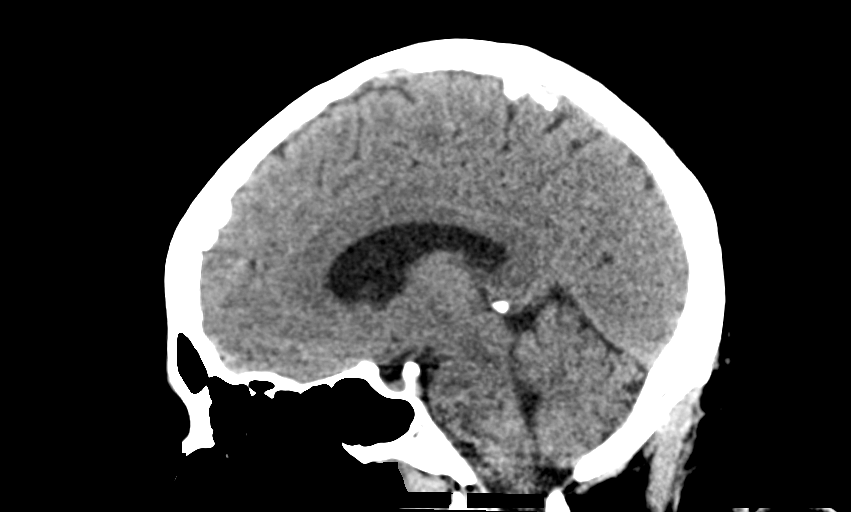
[im 45/67  brain]
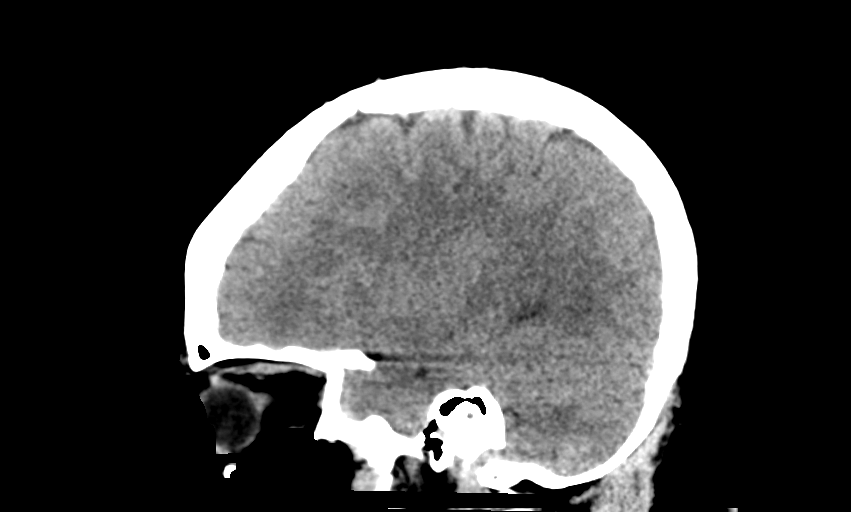

[14 of 47 positions shown; findings below may reference images not displayed]

FINDINGS: Brain: No evidence of acute infarction, hemorrhage, hydrocephalus,
extra-axial collection or mass lesion/mass effect.

Vascular: No hyperdense vessel or unexpected calcification.

Skull: Normal. Negative for fracture or focal lesion.

Sinuses/Orbits: No acute finding.

Other: None.
IMPRESSION: No acute intracranial abnormality identified. Stable unremarkable CT
of the head.

## 2020-10-03 ENCOUNTER — Ambulatory Visit: Payer: BC Managed Care – PPO

## 2020-10-03 ENCOUNTER — Other Ambulatory Visit: Payer: Self-pay | Admitting: Medical

## 2020-10-03 DIAGNOSIS — R52 Pain, unspecified: Secondary | ICD-10-CM

## 2020-10-04 ENCOUNTER — Ambulatory Visit
Admission: RE | Admit: 2020-10-04 | Discharge: 2020-10-04 | Disposition: A | Discharge status: Home / Self Care | Payer: BC Managed Care – PPO | Source: Ambulatory Visit | Attending: Medical | Admitting: Medical

## 2020-10-04 ENCOUNTER — Other Ambulatory Visit: Payer: Self-pay

## 2020-10-04 DIAGNOSIS — R52 Pain, unspecified: Secondary | ICD-10-CM

## 2023-06-24 ENCOUNTER — Telehealth: Payer: Self-pay

## 2023-06-24 NOTE — Telephone Encounter (Signed)
 Call from [facility], referral coordinator for [facility] cardiology. Patient is being seen by [facility] cardiology MD today in [place]. She has been paying cash for visits with [facility] cardiology MD  as her insurance does not cover [facility]. Dr. Pearly Bound at [facility] cardiology recommending cath, which would be covered at [facility]. Dr. Micael Adas recommends to get patient in with [facility]OD this week to get established and get cath scheduled. Per Eldonna Greenspan, patient is not inpatient at this time and does not need urgent cath. Forwarded to scheduling pool, Eldonna Greenspan states she will send referral materials today.

## 2023-07-20 NOTE — Progress Notes (Unsigned)
 Cardiology Office Note:    Date:  07/21/2023   ID:  Destiny Delgado, DOB 1970-07-13, MRN [id number]  PCP:  Burney Darice LITTIE, MD  Cardiologist:  None  Electrophysiologist:  None   Referring MD: Eloy Fairy Rigg, MD   Chief Complaint  Patient presents with   Chest Pain    History of Present Illness:    Destiny Delgado is a 53 y.o. female with a hx of hypertension, asthma, morbid obesity who is referred by Dr. Eloy for evaluation of chest pain.  Previously followed with Dr. Eloy in cardiology at [facility], last seen 05/2023.  She was admitted with hypertensive urgency and troponin elevation.  Underwent Lexiscan Myoview which  was intermediate risk.  She was referred to [facility] because it is in her insurance network.  Echocardiogram 05/12/2023 showed EF greater than 55%, mild to moderate LVH, normal RV function, no significant valvular disease.  Lexiscan Myoview at [facility] 04/2023 showed moderate partially reversible apical anterior/septal and mid anterior septal ischemia versus artifact (breast attenuation and motion artifact), EF 65%.  She reports that she has been having chest pain when she lies down at night, reports feels pressure in center left side of chest.  Sometimes wakes up with chest pain.  Also has pain down her left arm.  States that she does not exercise but stays active.  Denies any recent exertional chest pain.  Does report she gets short of breath with minimal exertion.  Has also been feeling heart palpitations about twice per week, last for 4 to 5 minutes.   Past Medical History:  Diagnosis Date   Anemia    Asthma    Complication of anesthesia    difficult to awaken   Head ache    History of blood transfusion 01/2012   at [facility]   History of chicken pox    Hypertension    Obesity    Renal disorder    SVD (spontaneous vaginal delivery)    x 4    Past Surgical History:  Procedure Laterality Date   ABDOMINAL HYSTERECTOMY     BILATERAL SALPINGECTOMY  Bilateral 03/19/2012   Procedure: BILATERAL SALPINGECTOMY;  Surgeon: Nena DELENA App, MD;  Location: [facility];  Service: Gynecology;  Laterality: Bilateral;  robotic assisted    CHOLECYSTECTOMY     CYSTOSCOPY W/ RETROGRADES Bilateral 03/19/2012   Procedure: CYSTOSCOPY WITH RETROGRADE PYELOGRAM;  Surgeon: Arlena LILLETTE Gal, MD;  Location: [facility];  Service: Urology;  Laterality: Bilateral;  with lighted bil stents   DILITATION & CURRETTAGE/HYSTROSCOPY WITH THERMACHOICE ABLATION  02/26/2012   Procedure: DILATATION & CURETTAGE/HYSTEROSCOPY WITH THERMACHOICE ABLATION;  Surgeon: Ovid DELENA All, MD;  Location: [facility];  Service: Gynecology;  Laterality: N/A;   HYSTEROSCOPY     ROBOTIC ASSISTED TOTAL HYSTERECTOMY N/A 03/19/2012   Procedure: ROBOTIC ASSISTED TOTAL HYSTERECTOMY;  Surgeon: Nena DELENA App, MD;  Location: [facility];  Service: Gynecology;  Laterality: N/A;   TUBAL LIGATION      Current Medications: Current Meds  Medication Sig   aspirin  EC 81 MG tablet Take 81 mg by mouth daily. Swallow whole.   atorvastatin (LIPITOR) 40 MG tablet Take by mouth daily.   bisoprolol-hydrochlorothiazide (ZIAC) 2.5-6.25 MG tablet Take 1 tablet by mouth daily.   Blood Glucose Monitoring Suppl (ONE TOUCH ULTRA 2) w/Device KIT as directed.   fluticasone  (FLONASE ) 50 MCG/ACT nasal spray Place 1 spray into both nostrils daily. (Patient taking differently: Place 1 spray into both nostrils as needed.)   hydrALAZINE  (  APRESOLINE ) 50 MG tablet Take 50 mg by mouth in the morning and at bedtime.   losartan  (COZAAR ) 100 MG tablet Take 100 mg by mouth daily.   naproxen  sodium (ALEVE ) 220 MG tablet Take 2 tablets (440 mg total) by mouth daily as needed (pain).   nitroGLYCERIN  (NITROSTAT ) 0.4 MG SL tablet Place 0.4 mg under the tongue.     Allergies:   Dilaudid  [hydromorphone  hcl], Hydrocodone , Ultram  [tramadol ], and Codeine   Social History   Socioeconomic History   Marital status: Married    Spouse name: Not on file    Number of children: 4   Years of education: Not on file   Highest education level: Some college, no degree  Occupational History   Not on file  Tobacco Use   Smoking status: Never   Smokeless tobacco: Never  Vaping Use   Vaping status: Never Used  Substance and Sexual Activity   Alcohol use: Yes    Comment: rare   Drug use: No   Sexual activity: Not on file  Other Topics Concern   Not on file  Social History Narrative   Lives at home with her husband   Right handed   Drinks one-two 20 oz bottles    Social Drivers of Health   Financial Resource Strain: Low Risk  (05/10/2023)   Received from [facility]   Overall Financial Resource Strain (CARDIA)    Difficulty of Paying Living Expenses: Not hard at all  Food Insecurity: No Food Insecurity (05/10/2023)   Received from [facility]   Hunger Vital Sign    Within the past 12 months, you worried that your food would run out before you got the money to buy more.: Never true    Within the past 12 months, the food you bought just didn't last and you didn't have money to get more.: Never true  Transportation Needs: No Transportation Needs (05/10/2023)   Received from [facility]   PRAPARE - Transportation    Lack of Transportation (Medical): No    Lack of Transportation (Non-Medical): No  Physical Activity: Inactive (05/10/2023)   Received from [facility]   Exercise Vital Sign    On average, how many days per week do you engage in moderate to strenuous exercise (like a brisk walk)?: 0 days    On average, how many minutes do you engage in exercise at this level?: 0 min  Stress: No Stress Concern Present (05/10/2023)   Received from [facility] of Occupational Health - Occupational Stress Questionnaire    Feeling of Stress : Only a little  Social Connections: Socially Integrated (05/10/2023)   Received from [facility]   Social Connection and Isolation Panel    In a typical week, how many  times do you talk on the phone with family, friends, or neighbors?: More than three times a week    How often do you get together with friends or relatives?: More than three times a week    How often do you attend church or religious services?: More than 4 times per year    Do you belong to any clubs or organizations such as church groups, unions, fraternal or athletic groups, or school groups?: Yes    How often do you attend meetings of the clubs or organizations you belong to?: More than 4 times per year    Are you married, widowed, divorced, separated, never married, or living with a partner?:  Married     Family History: The patient's family history includes Anemia in her mother; Asthma in her daughter; Breast cancer in her mother; Diabetes in her brother, father, maternal aunt, maternal uncle, mother, paternal aunt, and paternal uncle; Heart disease in her mother; Hypertension in her father; Stroke in her father; Thyroid  disease in her mother.  ROS:   Please see the history of present illness.     All other systems reviewed and are negative.  EKGs/Labs/Other Studies Reviewed:    The following studies were reviewed today:   EKG:  07/21/2023: Sinus rhythm with frequent PVCs, rate 63  Recent Labs: No results found for requested labs within last 365 days.  Recent Lipid Panel    Component Value Date/Time   CHOL 169 02/20/2018 0534   TRIG 153 (H) 02/20/2018 0534   HDL 44 02/20/2018 0534   CHOLHDL 3.8 02/20/2018 0534   VLDL 31 02/20/2018 0534   LDLCALC 94 02/20/2018 0534    Physical Exam:    VS:  BP 110/84   Pulse 63   Ht 5' 11 (1.803 m)   Wt (!) 328 lb 6.4 oz (149 kg)   LMP 03/11/2012   SpO2 97%   BMI 45.80 kg/m     Wt Readings from Last 3 Encounters:  07/21/23 (!) 328 lb 6.4 oz (149 kg)  02/25/18 (!) 316 lb (143.3 kg)  02/19/18 (!) 315 lb (142.9 kg)     GEN:  Well nourished, well developed in no acute distress HEENT: Normal NECK: No JVD; No carotid  bruits LYMPHATICS: No lymphadenopathy CARDIAC: RRR, no murmurs, rubs, gallops RESPIRATORY:  Clear to auscultation without rales, wheezing or rhonchi  ABDOMEN: Soft, non-tender, non-distended MUSCULOSKELETAL:  No edema; No deformity  SKIN: Warm and dry NEUROLOGIC:  Alert and oriented x 3 PSYCHIATRIC:  Normal affect   ASSESSMENT:    1. Precordial pain   2. DOE (dyspnea on exertion)   3. Hyperlipidemia, unspecified hyperlipidemia type   4. Hypertension, unspecified type   5. Frequent PVCs   6. Snoring   7. Morbid obesity (HCC)    PLAN:    Chest pain/DOE: Reports atypical chest pain but also having dyspnea with exertion that could represent anginal equivalent.  Echocardiogram 05/12/2023 showed EF greater than 55%, mild to moderate LVH, normal RV function, no significant valvular disease.  Lexiscan Myoview at [facility] 04/2023 showed moderate partially reversible apical anterior/septal and mid anteroseptal,  ischemia versus artifact (breast attenuation and motion artifact), EF 65%. - Possible ischemia versus artifact on Myoview at [facility].  Recommend stress PET for further evaluation  Frequent PVCs: Noted on EKG in clinic today.  She is reporting intermittent palpitations.  Check BMET, magnesium , TSH.  Plan Zio patch x 7 days to evaluate PVC burden  Hypertension: On amlodipine  10 mg daily, bisoprolol-HCTZ 5-12.5 mg daily, hydralazine  50 mg 3 times daily, losartan  100 mg daily.  Appears controlled.  Suspect untreated OSA contributing to resistant hypertension, check sleep study.  Check BMET  Hyperlipidemia: On atorvastatin 40 mg daily.  Check lipid panel  Morbid obesity: Body mass index is 45.8 kg/m.  Diet/exercise recommended  T2DM: A1c 6.8%.  On glipizide  Snoring/daytime somnolence: Check Itamar sleep study.  STOP-BANG 5  RTC in 6 months  Informed Consent   Shared Decision Making/Informed Consent The risks [chest pain, shortness of breath, cardiac arrhythmias,  dizziness, blood pressure fluctuations, myocardial infarction, stroke/transient ischemic attack, nausea, vomiting, allergic reaction, radiation exposure, metallic taste sensation and life-threatening complications (estimated to  be 1 in 10,000)], benefits (risk stratification, diagnosing coronary artery disease, treatment guidance) and alternatives of a cardiac PET stress test were discussed in detail with Ms. Clapp and she agrees to proceed.       Medication Adjustments/Labs and Tests Ordered: Current medicines are reviewed at length with the patient today.  Concerns regarding medicines are outlined above.  Orders Placed This Encounter  Procedures   NM PET CT CARDIAC PERFUSION MULTI W/ABSOLUTE BLOODFLOW   Basic Metabolic Panel (BMET)   Magnesium    TSH   Lipid panel   LONG TERM MONITOR (3-14 DAYS)   EKG 12-Lead   Itamar Sleep Study   No orders of the defined types were placed in this encounter.   Patient Instructions  Medication Instructions:  Continue current medications *If you need a refill on your cardiac medications before your next appointment, please call your pharmacy*  Lab Work: Tsh, bmet, mg today If you have labs (blood work) drawn today and your tests are completely normal, you will receive your results only by: MyChart Message (if you have MyChart) OR A paper copy in the mail If you have any lab test that is abnormal or we need to change your treatment, we will call you to review the results.  Testing/Procedures: Itamar WatchPAT?  Is a FDA cleared portable home sleep study test that uses a watch and 3 points of contact to monitor 7 different channels, including your heart rate, oxygen  saturations, body position, snoring, and chest motion.  The study is easy to use from the comfort of your own home and accurately detect sleep apnea.  Before bed, you attach the chest sensor, attached the sleep apnea bracelet to your nondominant hand, and attach the finger probe.  After  the study, the raw data is downloaded from the watch and scored for apnea events.   For more information: [email or web address]  Patient Testing Instructions:  Do not put battery into the device until bedtime when you are ready to begin the test. Please call the support number if you need assistance after following the instructions below: 24 hour support line- [phone] or ITAMAR support at [phone] (option 2)  Download the Itamar WatchPAT One app through the google play store or App Store  Be sure to turn on or enable access to bluetooth in settlings on your smartphone/ device  Make sure no other bluetooth devices are on and within the vicinity of your smartphone/ device and WatchPAT watch during testing.  Make sure to leave your smart phone/ device plugged in and charging all night.  When ready for bed:  Follow the instructions step by step in the WatchPAT One App to activate the testing device. For additional instructions, including video instruction, visit the WatchPAT One video on Youtube. You can search for WatchPat One within Youtube (video is 4 minutes and 18 seconds) or enter: https://youtube/[email or web address]?[email or web address] Please note: You will be prompted to enter a Pin to connect via bluetooth when starting the test. The PIN will be assigned to you when you receive the test.  The device is disposable, but it recommended that you retain the device until you receive a call letting you know the study has been received and the results have been interpreted.  We will let you know if the study did not transmit to us  properly after the test is completed. You do not need to call us  to confirm the receipt of the test.  Please complete the test within 48 hours of receiving PIN.  Frequently Asked Questions:  What is Watch Bruna one?  A single use fully disposable home sleep apnea testing device and will not need to be returned after completion.  What are the requirements to use  WatchPAT one?  The be able to have a successful watchpat one sleep study, you should have your Watch pat one device, your smart phone, watch pat one app, your PIN number and Internet access What type of phone do I need?  You should have a smart phone that uses Android 5.1 and above or any Iphone with IOS 10 and above How can I download the WatchPAT one app?  Based on your device type search for WatchPAT one app either in google play for android devices or APP store for Iphone's Where will I get my PIN for the study?  Your PIN will be provided by your physician's office. It is used for authentication and if you lose/forget your PIN, please reach out to your providers office.  I do not have Internet at home. Can I do WatchPAT one study?  WatchPAT One needs Internet connection throughout the night to be able to transmit the sleep data. You can use your home/local internet or your cellular's data package. However, it is always recommended to use home/local Internet. It is estimated that between 20MB-30MB will be used with each study.However, the application will be looking for space in the phone to start the study.  What happens if I lose internet or bluetooth connection?  During the internet disconnection, your phone will not be able to transmit the sleep data. All the data, will be stored in your phone. As soon as the internet connection is back on, the phone will being sending the sleep data. During the bluetooth disconnection, WatchPAT one will not be able to to send the sleep data to your phone. Data will be kept in the WatchPAT one until two devices have bluetooth connection back on. As soon as the connection is back on, WatchPAT one will send the sleep data to the phone.  How long do I need to wear the WatchPAT one?  After you start the study, you should wear the device at least 6 hours.  How far should I keep my phone from the device?  During the night, your phone should be within 15 feet.   What happens if I leave the room for restroom or other reasons?  Leaving the room for any reason will not cause any problem. As soon as your get back to the room, both devices will reconnect and will continue to send the sleep data. Can I use my phone during the sleep study?  Yes, you can use your phone as usual during the study. But it is recommended to put your watchpat one on when you are ready to go to bed.  How will I get my study results?  A soon as you completed your study, your sleep data will be sent to the provider. They will then share the results with you when they are ready.     Follow-Up: At [facility], you and your health needs are our priority.  As part of our continuing mission to provide you with exceptional heart care, our providers are all part of one team.  This team includes your primary Cardiologist (physician) and Advanced Practice Providers or APPs (Physician Assistants and Nurse Practitioners) who all work together to provide you with the care you need, when you need it.  Your next appointment:  3 month(s)  Provider:  Dr. Evander  We recommend signing up for the patient portal called MyChart.  Sign up information is provided on this After Visit Summary.  MyChart is used to connect with patients for Virtual Visits (Telemedicine).  Patients are able to view lab/test results, encounter notes, upcoming appointments, etc.  Non-urgent messages can be sent to your provider as well.   To learn more about what you can do with MyChart, go to [email or web address].   Other Instructions    Please report to Radiology at the [facility] Main Entrance 30 minutes early for your test.  [address] [place], [place] [address]                         How to Prepare for Your Cardiac PET/CT Stress Test:  Nothing to eat or drink, except water , 3 hours prior to arrival time.  NO caffeine/decaffeinated products, or chocolate 12 hours prior to arrival.  (Please note decaffeinated beverages (teas/coffees) still contain caffeine).  If you have caffeine within 12 hours prior, the test will need to be rescheduled.  Medication instructions: Do not take erectile dysfunction medications for 72 hours prior to test (sildenafil, tadalafil) Do not take nitrates (isosorbide mononitrate, Ranexa) the day before or day of test Do not take tamsulosin the day before or morning of test Hold theophylline containing medications for 12 hours. Hold Dipyridamole 48 hours prior to the test.  Diabetic Preparation: If able to eat breakfast prior to 3 hour fasting, you may take all medications, including your insulin. Do not worry if you miss your breakfast dose of insulin - start at your next meal. If you do not eat prior to 3 hour fast-Hold all diabetes (oral and insulin) medications. Patients who wear a continuous glucose monitor MUST remove the device prior to scanning.  You may take your remaining medications with water .  NO perfume, cologne or lotion on chest or abdomen area. FEMALES - Please avoid wearing dresses to this appointment.  Total time is 1 to 2 hours; you may want to bring reading material for the waiting time.  IF YOU THINK YOU MAY BE PREGNANT, OR ARE NURSING PLEASE INFORM THE TECHNOLOGIST.  In preparation for your appointment, medication and supplies will be purchased.  Appointment availability is limited, so if you need to cancel or reschedule, please call the Radiology Department Scheduler at [phone] 24 hours in advance to avoid a cancellation fee of $100.00  What to Expect When you Arrive:  Once you arrive and check in for your appointment, you will be taken to a preparation room within the Radiology Department.  A technologist or Nurse will obtain your medical history, verify that you are correctly prepped for the exam, and explain the procedure.  Afterwards, an IV will be started in your arm and electrodes will be placed on your skin  for EKG monitoring during the stress portion of the exam. Then you will be escorted to the PET/CT scanner.  There, staff will get you positioned on the scanner and obtain a blood pressure and EKG.  During the exam, you will continue to be connected to the EKG and blood pressure machines.  A small, safe amount of a radioactive tracer will be injected in your IV to obtain a series of pictures of your heart along with an injection of a stress agent.    After your Exam:  It is recommended that you eat a meal and  drink a caffeinated beverage to counter act any effects of the stress agent.  Drink plenty of fluids for the remainder of the day and urinate frequently for the first couple of hours after the exam.  Your doctor will inform you of your test results within 7-10 business days.  For more information and frequently asked questions, please visit our website: [email or web address]  For questions about your test or how to prepare for your test, please call: Cardiac Imaging Nurse Navigators Office: [phone]       ZIO ZIO XT- Long Term Monitor Instructions  Your physician has requested you wear a ZIO patch monitor for 7 days.  This is a single patch monitor. [employer] supplies one patch monitor per enrollment. Additional stickers are not available. Please do not apply patch if you will be having a Nuclear Stress Test,  Echocardiogram, Cardiac CT, MRI, or Chest Xray during the period you would be wearing the  monitor. The patch cannot be worn during these tests. You cannot remove and re-apply the  ZIO XT patch monitor.  Your ZIO patch monitor will be mailed 3 day USPS to your address on file. It may take 3-5 days  to receive your monitor after you have been enrolled.  Once you have received your monitor, please review the enclosed instructions. Your monitor  has already been registered assigning a specific monitor serial # to you.  Billing and Patient Assistance Program  Information  We have supplied [employer] with any of your insurance information on file for billing purposes. [employer] offers a sliding scale Patient Assistance Program for patients that do not have  insurance, or whose insurance does not completely cover the cost of the ZIO monitor.  You must apply for the Patient Assistance Program to qualify for this discounted rate.  To apply, please call [employer] at [phone], select option 4, select option 2, ask to apply for  Patient Assistance Program. Meredeth will ask your household income, and how many people  are in your household. They will quote your out-of-pocket cost based on that information.  [employer] will also be able to set up a 47-month, interest-free payment plan if needed.  Applying the monitor   Shave hair from upper left chest.  Hold abrader disc by orange tab. Rub abrader in 40 strokes over the upper left chest as  indicated in your monitor instructions.  Clean area with 4 enclosed alcohol pads. Let dry.  Apply patch as indicated in monitor instructions. Patch will be placed under collarbone on left  side of chest with arrow pointing upward.  Rub patch adhesive wings for 2 minutes. Remove white label marked 1. Remove the white  label marked 2. Rub patch adhesive wings for 2 additional minutes.  While looking in a mirror, press and release button in center of patch. A small green light will  flash 3-4 times. This will be your only indicator that the monitor has been turned on.  Do not shower for the first 24 hours. You may shower after the first 24 hours.  Press the button if you feel a symptom. You will hear a small click. Record Date, Time and  Symptom in the Patient Logbook.  When you are ready to remove the patch, follow instructions on the last 2 pages of Patient  Logbook. Stick patch monitor onto the last page of Patient Logbook.  Place Patient Logbook in the blue and white box. Use locking tab on box and tape box closed   securely. The blue  and white box has prepaid postage on it. Please place it in the mailbox as  soon as possible. Your physician should have your test results approximately 7 days after the  monitor has been mailed back to [facility].  Call [facility] Customer Care at [phone] if you have questions regarding  your ZIO XT patch monitor. Call them immediately if you see an orange light blinking on your  monitor.  If your monitor falls off in less than 4 days, contact our Monitor department at [phone].  If your monitor becomes loose or falls off after 4 days call [employer] at [phone] for  suggestions on securing your monitor    Signed, Lonni LITTIE Nanas, MD  07/21/2023 5:56 PM    [facility]

## 2023-07-21 ENCOUNTER — Telehealth: Payer: Self-pay | Admitting: Radiology

## 2023-07-21 ENCOUNTER — Encounter: Payer: Self-pay | Admitting: Cardiology

## 2023-07-21 ENCOUNTER — Ambulatory Visit: Payer: PRIVATE HEALTH INSURANCE

## 2023-07-21 ENCOUNTER — Ambulatory Visit: Payer: PRIVATE HEALTH INSURANCE | Attending: Cardiology | Admitting: Cardiology

## 2023-07-21 VITALS — BP 110/84 | HR 63 | Ht 71.0 in | Wt 328.4 lb

## 2023-07-21 DIAGNOSIS — R072 Precordial pain: Secondary | ICD-10-CM | POA: Diagnosis not present

## 2023-07-21 DIAGNOSIS — R0609 Other forms of dyspnea: Secondary | ICD-10-CM

## 2023-07-21 DIAGNOSIS — E785 Hyperlipidemia, unspecified: Secondary | ICD-10-CM

## 2023-07-21 DIAGNOSIS — I493 Ventricular premature depolarization: Secondary | ICD-10-CM

## 2023-07-21 DIAGNOSIS — I10 Essential (primary) hypertension: Secondary | ICD-10-CM | POA: Diagnosis not present

## 2023-07-21 DIAGNOSIS — R0683 Snoring: Secondary | ICD-10-CM

## 2023-07-21 NOTE — Progress Notes (Unsigned)
 Enrolled patient for a 7 day Zio XT monitor to be mailed to patients home.

## 2023-07-21 NOTE — Telephone Encounter (Signed)
 Patient agreement reviewed and signed on 07/21/2023.  WatchPAT issued to patient on 07/21/2023 by Woodie LOISE Prudent. Patient aware to not open the WatchPAT box until contacted with the activation PIN. Patient profile initialized in CloudPAT on 07/21/2023 by Rockie RAMAN. Device serial number: [id number]  Please list Reason for Call as Advice Only and type WatchPAT issued to patient in the comment box.

## 2023-07-21 NOTE — Patient Instructions (Addendum)
 Medication Instructions:  Continue current medications *If you need a refill on your cardiac medications before your next appointment, please call your pharmacy*  Lab Work: Tsh, bmet, mg today If you have labs (blood work) drawn today and your tests are completely normal, you will receive your results only by: MyChart Message (if you have MyChart) OR A paper copy in the mail If you have any lab test that is abnormal or we need to change your treatment, we will call you to review the results.  Testing/Procedures: [facility] WatchPAT?  Is a FDA cleared portable home sleep study test that uses a watch and 3 points of contact to monitor 7 different channels, including your heart rate, oxygen  saturations, body position, snoring, and chest motion.  The study is easy to use from the comfort of your own home and accurately detect sleep apnea.  Before bed, you attach the chest sensor, attached the sleep apnea bracelet to your nondominant hand, and attach the finger probe.  After the study, the raw data is downloaded from the watch and scored for apnea events.   For more information: [email or web address]  Patient Testing Instructions:  Do not put battery into the device until bedtime when you are ready to begin the test. Please call the support number if you need assistance after following the instructions below: 24 hour support line- [phone] or [facility] support at [phone] (option 2)  Download the [facility] WatchPAT One app through the google play store or App Store  Be sure to turn on or enable access to bluetooth in settlings on your smartphone/ device  Make sure no other bluetooth devices are on and within the vicinity of your smartphone/ device and WatchPAT watch during testing.  Make sure to leave your smart phone/ device plugged in and charging all night.  When ready for bed:  Follow the instructions step by step in the WatchPAT One App to activate the testing device. For  additional instructions, including video instruction, visit the WatchPAT One video on Youtube. You can search for WatchPat One within Youtube (video is 4 minutes and 18 seconds) or enter: https://[email or web address] Please note: You will be prompted to enter a Pin to connect via bluetooth when starting the test. The PIN will be assigned to you when you receive the test.  The device is disposable, but it recommended that you retain the device until you receive a call letting you know the study has been received and the results have been interpreted.  We will let you know if the study did not transmit to us  properly after the test is completed. You do not need to call us  to confirm the receipt of the test.  Please complete the test within 48 hours of receiving PIN.   Frequently Asked Questions:  What is Watch Bruna one?  A single use fully disposable home sleep apnea testing device and will not need to be returned after completion.  What are the requirements to use WatchPAT one?  The be able to have a successful watchpat one sleep study, you should have your Watch pat one device, your smart phone, watch pat one app, your PIN number and Internet access What type of phone do I need?  You should have a smart phone that uses Android 5.1 and above or any Iphone with IOS 10 and above How can I download the WatchPAT one app?  Based on your device type search for WatchPAT one app either in google play for android devices or APP store  for Iphone's Where will I get my PIN for the study?  Your PIN will be provided by your physician's office. It is used for authentication and if you lose/forget your PIN, please reach out to your providers office.  I do not have Internet at home. Can I do WatchPAT one study?  WatchPAT One needs Internet connection throughout the night to be able to transmit the sleep data. You can use your home/local internet or your cellular's data package. However, it is always  recommended to use home/local Internet. It is estimated that between 20MB-30MB will be used with each study.However, the application will be looking for space in the phone to start the study.  What happens if I lose internet or bluetooth connection?  During the internet disconnection, your phone will not be able to transmit the sleep data. All the data, will be stored in your phone. As soon as the internet connection is back on, the phone will being sending the sleep data. During the bluetooth disconnection, WatchPAT one will not be able to to send the sleep data to your phone. Data will be kept in the WatchPAT one until two devices have bluetooth connection back on. As soon as the connection is back on, WatchPAT one will send the sleep data to the phone.  How long do I need to wear the WatchPAT one?  After you start the study, you should wear the device at least 6 hours.  How far should I keep my phone from the device?  During the night, your phone should be within 15 feet.  What happens if I leave the room for restroom or other reasons?  Leaving the room for any reason will not cause any problem. As soon as your get back to the room, both devices will reconnect and will continue to send the sleep data. Can I use my phone during the sleep study?  Yes, you can use your phone as usual during the study. But it is recommended to put your watchpat one on when you are ready to go to bed.  How will I get my study results?  A soon as you completed your study, your sleep data will be sent to the provider. They will then share the results with you when they are ready.     Follow-Up: At [facility], you and your health needs are our priority.  As part of our continuing mission to provide you with exceptional heart care, our providers are all part of one team.  This team includes your primary Cardiologist (physician) and Advanced Practice Providers or APPs (Physician Assistants and Nurse  Practitioners) who all work together to provide you with the care you need, when you need it.  Your next appointment:   3 month(s)  Provider:  Dr. Evander  We recommend signing up for the patient portal called MyChart.  Sign up information is provided on this After Visit Summary.  MyChart is used to connect with patients for Virtual Visits (Telemedicine).  Patients are able to view lab/test results, encounter notes, upcoming appointments, etc.  Non-urgent messages can be sent to your provider as well.   To learn more about what you can do with MyChart, go to [email or web address].   Other Instructions    Please report to Radiology at the [facility] Main Entrance 30 minutes early for your test.  [address] [place], [place] [address]  How to Prepare for Your Cardiac PET/CT Stress Test:  Nothing to eat or drink, except water , 3 hours prior to arrival time.  NO caffeine/decaffeinated products, or chocolate 12 hours prior to arrival. (Please note decaffeinated beverages (teas/coffees) still contain caffeine).  If you have caffeine within 12 hours prior, the test will need to be rescheduled.  Medication instructions: Do not take erectile dysfunction medications for 72 hours prior to test (sildenafil, tadalafil) Do not take nitrates (isosorbide mononitrate, Ranexa) the day before or day of test Do not take tamsulosin the day before or morning of test Hold theophylline containing medications for 12 hours. Hold Dipyridamole 48 hours prior to the test.  Diabetic Preparation: If able to eat breakfast prior to 3 hour fasting, you may take all medications, including your insulin. Do not worry if you miss your breakfast dose of insulin - start at your next meal. If you do not eat prior to 3 hour fast-Hold all diabetes (oral and insulin) medications. Patients who wear a continuous glucose monitor MUST remove the device prior to scanning.  You may  take your remaining medications with water .  NO perfume, cologne or lotion on chest or abdomen area. FEMALES - Please avoid wearing dresses to this appointment.  Total time is 1 to 2 hours; you may want to bring reading material for the waiting time.  IF YOU THINK YOU MAY BE PREGNANT, OR ARE NURSING PLEASE INFORM THE TECHNOLOGIST.  In preparation for your appointment, medication and supplies will be purchased.  Appointment availability is limited, so if you need to cancel or reschedule, please call the Radiology Department Scheduler at [phone] 24 hours in advance to avoid a cancellation fee of $100.00  What to Expect When you Arrive:  Once you arrive and check in for your appointment, you will be taken to a preparation room within the Radiology Department.  A technologist or Nurse will obtain your medical history, verify that you are correctly prepped for the exam, and explain the procedure.  Afterwards, an IV will be started in your arm and electrodes will be placed on your skin for EKG monitoring during the stress portion of the exam. Then you will be escorted to the PET/CT scanner.  There, staff will get you positioned on the scanner and obtain a blood pressure and EKG.  During the exam, you will continue to be connected to the EKG and blood pressure machines.  A small, safe amount of a radioactive tracer will be injected in your IV to obtain a series of pictures of your heart along with an injection of a stress agent.    After your Exam:  It is recommended that you eat a meal and drink a caffeinated beverage to counter act any effects of the stress agent.  Drink plenty of fluids for the remainder of the day and urinate frequently for the first couple of hours after the exam.  Your doctor will inform you of your test results within 7-10 business days.  For more information and frequently asked questions, please visit our website: [email or web address]  For questions about  your test or how to prepare for your test, please call: Cardiac Imaging Nurse Navigators Office: [phone]       ZIO ZIO XT- Long Term Monitor Instructions  Your physician has requested you wear a ZIO patch monitor for 7 days.  This is a single patch monitor. [employer] supplies one patch monitor per enrollment. Additional stickers are not available. Please do not apply patch if you  will be having a Nuclear Stress Test,  Echocardiogram, Cardiac CT, MRI, or Chest Xray during the period you would be wearing the  monitor. The patch cannot be worn during these tests. You cannot remove and re-apply the  ZIO XT patch monitor.  Your ZIO patch monitor will be mailed 3 day USPS to your address on file. It may take 3-5 days  to receive your monitor after you have been enrolled.  Once you have received your monitor, please review the enclosed instructions. Your monitor  has already been registered assigning a specific monitor serial # to you.  Billing and Patient Assistance Program Information  We have supplied [employer] with any of your insurance information on file for billing purposes. [employer] offers a sliding scale Patient Assistance Program for patients that do not have  insurance, or whose insurance does not completely cover the cost of the ZIO monitor.  You must apply for the Patient Assistance Program to qualify for this discounted rate.  To apply, please call [employer] at [phone], select option 4, select option 2, ask to apply for  Patient Assistance Program. Meredeth will ask your household income, and how many people  are in your household. They will quote your out-of-pocket cost based on that information.  [employer] will also be able to set up a 69-month, interest-free payment plan if needed.  Applying the monitor   Shave hair from upper left chest.  Hold abrader disc by orange tab. Rub abrader in 40 strokes over the upper left chest as  indicated in your monitor instructions.   Clean area with 4 enclosed alcohol pads. Let dry.  Apply patch as indicated in monitor instructions. Patch will be placed under collarbone on left  side of chest with arrow pointing upward.  Rub patch adhesive wings for 2 minutes. Remove white label marked 1. Remove the white  label marked 2. Rub patch adhesive wings for 2 additional minutes.  While looking in a mirror, press and release button in center of patch. A small green light will  flash 3-4 times. This will be your only indicator that the monitor has been turned on.  Do not shower for the first 24 hours. You may shower after the first 24 hours.  Press the button if you feel a symptom. You will hear a small click. Record Date, Time and  Symptom in the Patient Logbook.  When you are ready to remove the patch, follow instructions on the last 2 pages of Patient  Logbook. Stick patch monitor onto the last page of Patient Logbook.  Place Patient Logbook in the blue and white box. Use locking tab on box and tape box closed  securely. The blue and white box has prepaid postage on it. Please place it in the mailbox as  soon as possible. Your physician should have your test results approximately 7 days after the  monitor has been mailed back to [facility].  Call [facility] Customer Care at [phone] if you have questions regarding  your ZIO XT patch monitor. Call them immediately if you see an orange light blinking on your  monitor.  If your monitor falls off in less than 4 days, contact our Monitor department at [phone].  If your monitor becomes loose or falls off after 4 days call [employer] at [phone] for  suggestions on securing your monitor

## 2023-07-22 ENCOUNTER — Ambulatory Visit: Payer: Self-pay | Admitting: Cardiology

## 2023-07-22 LAB — BASIC METABOLIC PANEL WITH GFR
BUN/Creatinine Ratio: 14 (ref 9–23)
BUN: 18 mg/dL (ref 6–24)
CO2: 23 mmol/L (ref 20–29)
Calcium: 9.6 mg/dL (ref 8.7–10.2)
Chloride: 101 mmol/L (ref 96–106)
Creatinine, Ser: 1.26 mg/dL — ABNORMAL HIGH (ref 0.57–1.00)
Glucose: 127 mg/dL — ABNORMAL HIGH (ref 70–99)
Potassium: 4.4 mmol/L (ref 3.5–5.2)
Sodium: 140 mmol/L (ref 134–144)
eGFR: 51 mL/min/{1.73_m2} — ABNORMAL LOW (ref >59)

## 2023-07-22 LAB — LIPID PANEL
Chol/HDL Ratio: 4.1 ratio (ref 0.0–4.4)
Cholesterol, Total: 172 mg/dL (ref 100–199)
HDL: 42 mg/dL (ref >39)
LDL Chol Calc (NIH): 105 mg/dL — ABNORMAL HIGH (ref 0–99)
Triglycerides: 141 mg/dL (ref 0–149)
VLDL Cholesterol Cal: 25 mg/dL (ref 5–40)

## 2023-07-22 LAB — MAGNESIUM: Magnesium: 2 mg/dL (ref 1.6–2.3)

## 2023-07-22 LAB — TSH: TSH: 2.91 u[IU]/mL (ref 0.450–4.500)

## 2023-07-26 ENCOUNTER — Encounter (HOSPITAL_COMMUNITY): Payer: Self-pay

## 2023-07-29 ENCOUNTER — Encounter (HOSPITAL_COMMUNITY)
Admission: RE | Admit: 2023-07-29 | Discharge: 2023-07-29 | Disposition: A | Discharge status: Home / Self Care | Payer: PRIVATE HEALTH INSURANCE | Source: Ambulatory Visit | Attending: Cardiology | Admitting: Cardiology

## 2023-07-29 DIAGNOSIS — R072 Precordial pain: Secondary | ICD-10-CM | POA: Diagnosis present

## 2023-07-29 MED ORDER — REGADENOSON 0.4 MG/5ML IV SOLN
0.4000 mg | Freq: Once | INTRAVENOUS | Status: AC
  Administered 2023-07-29: 0.4 mg via INTRAVENOUS

## 2023-07-29 MED ORDER — RUBIDIUM RB82 GENERATOR (RUBYFILL)
28.0000 | PACK | Freq: Once | INTRAVENOUS | Status: AC
  Administered 2023-07-29: 28.82 via INTRAVENOUS

## 2023-07-29 MED ORDER — RUBIDIUM RB82 GENERATOR (RUBYFILL)
28.1000 | PACK | Freq: Once | INTRAVENOUS | Status: AC
Start: 2023-07-29 — End: 2023-07-29
  Administered 2023-07-29: 28.1 via INTRAVENOUS

## 2023-07-29 MED ORDER — REGADENOSON 0.4 MG/5ML IV SOLN
INTRAVENOUS | Status: AC
Start: 2023-07-29 — End: 2023-07-29
  Filled 2023-07-29: qty 5

## 2023-07-29 NOTE — Progress Notes (Signed)
 Pt. Tolerated lexi scan well.

## 2023-07-30 LAB — NM PET CT CARDIAC PERFUSION MULTI W/ABSOLUTE BLOODFLOW
LV dias vol: 82 mL (ref 46–106)
LV sys vol: 37 mL (ref 3.8–5.2)
MBFR: 2.94
Nuc Rest EF: 55 %
Nuc Stress EF: 69 %
Peak HR: 104 {beats}/min
Rest HR: 73 {beats}/min
Rest MBF: 0.67 ml/g/min
Rest Nuclear Isotope Dose: 28.1 mCi
ST Depression (mm): 0 mm
Stress MBF: 1.97 ml/g/min
Stress Nuclear Isotope Dose: 28.8 mCi

## 2023-10-24 ENCOUNTER — Telehealth: Payer: Self-pay

## 2023-10-24 NOTE — Telephone Encounter (Incomplete)
 Ordering provider: Dr. Kate Associated diagnoses:  Snoring (R06.83) WatchPAT PA obtained on 10/24/2023 by Dena JAYSON Hesselbach, CMA. Authorization: No prior authorization was required per [facility].  Patient notified of PIN ([id number]) on 10/24/2023 via {Notification Method:30571}.  Phone note routed to covering staff for follow-up.  Instructions for covering staff:  Please contact patient in 2 weeks if WatchPAT study results are not available yet. Remind patient to complete test.  If patient declines to proceed with test, please confirm that box is unopened and remind patient to return it to the office within 30 days. Route phone note to [facility] SLEEP STUDIES pool for tracking.  If box has been opened, please route phone note to CV DIV SLEEP STUDIES pool to have device de-initialized and processed for billing.

## 2023-10-26 NOTE — Progress Notes (Deleted)
 Cardiology Office Note:    Date:  10/26/2023   ID:  Destiny Delgado, DOB 07/29/1970, MRN [id number]  PCP:  Burney Darice LITTIE, MD  Cardiologist:  None  Electrophysiologist:  None   Referring MD: Burney Darice LITTIE, MD   No chief complaint on file.   History of Present Illness:    Destiny Delgado is a 53 y.o. female with a hx of hypertension, asthma, morbid obesity who has been followed.  Referred by Dr. Eloy for evaluation of chest pain, initially seen 06/2023.  Previously followed with Dr. Eloy in cardiology at [facility], last seen 05/2023.  She was admitted with hypertensive urgency and troponin elevation.  Underwent Lexiscan  Myoview which  was intermediate risk.  She was referred to [facility] because it is in her insurance network.  Echocardiogram 05/12/2023 showed EF greater than 55%, mild to moderate LVH, normal RV function, no significant valvular disease.  Lexiscan  Myoview at [facility] 04/2023 showed moderate partially reversible apical anterior/septal and mid anterior septal ischemia versus artifact (breast attenuation and motion artifact), EF 65%.  Stress PET 10/26/2023 showed normal perfusion, normal myocardial blood flow reserve, LVEF 55%, no coronary calcifications.  Since last clinic visit,  She reports that she has been having chest pain when she lies down at night, reports feels pressure in center left side of chest.  Sometimes wakes up with chest pain.  Also has pain down her left arm.  States that she does not exercise but stays active.  Denies any recent exertional chest pain.  Does report she gets short of breath with minimal exertion.  Has also been feeling heart palpitations about twice per week, last for 4 to 5 minutes.   Past Medical History:  Diagnosis Date   Anemia    Asthma    Complication of anesthesia    difficult to awaken   Head ache    History of blood transfusion 01/2012   at [facility]   History of chicken pox    Hypertension    Obesity    Renal disorder     SVD (spontaneous vaginal delivery)    x 4    Past Surgical History:  Procedure Laterality Date   ABDOMINAL HYSTERECTOMY     BILATERAL SALPINGECTOMY Bilateral 03/19/2012   Procedure: BILATERAL SALPINGECTOMY;  Surgeon: Nena DELENA App, MD;  Location: [facility];  Service: Gynecology;  Laterality: Bilateral;  robotic assisted    CHOLECYSTECTOMY     CYSTOSCOPY W/ RETROGRADES Bilateral 03/19/2012   Procedure: CYSTOSCOPY WITH RETROGRADE PYELOGRAM;  Surgeon: Arlena LILLETTE Gal, MD;  Location: [facility];  Service: Urology;  Laterality: Bilateral;  with lighted bil stents   DILITATION & CURRETTAGE/HYSTROSCOPY WITH THERMACHOICE ABLATION  02/26/2012   Procedure: DILATATION & CURETTAGE/HYSTEROSCOPY WITH THERMACHOICE ABLATION;  Surgeon: Ovid DELENA All, MD;  Location: [facility];  Service: Gynecology;  Laterality: N/A;   HYSTEROSCOPY     ROBOTIC ASSISTED TOTAL HYSTERECTOMY N/A 03/19/2012   Procedure: ROBOTIC ASSISTED TOTAL HYSTERECTOMY;  Surgeon: Nena DELENA App, MD;  Location: [facility];  Service: Gynecology;  Laterality: N/A;   TUBAL LIGATION      Current Medications: No outpatient medications have been marked as taking for the 10/28/23 encounter (Appointment) with Kate Lonni LITTIE, MD.     Allergies:   Dilaudid  [hydromorphone  hcl], Hydrocodone , Ultram  [tramadol ], and Codeine   Social History   Socioeconomic History   Marital status: Married    Spouse name: Not on file   Number of children: 4   Years of education:  Not on file   Highest education level: Some college, no degree  Occupational History   Not on file  Tobacco Use   Smoking status: Never   Smokeless tobacco: Never  Vaping Use   Vaping status: Never Used  Substance and Sexual Activity   Alcohol use: Yes    Comment: rare   Drug use: No   Sexual activity: Not on file  Other Topics Concern   Not on file  Social History Narrative   Lives at home with her husband   Right handed   Drinks one-two 20 oz bottles    Social Drivers of  Health   Financial Resource Strain: Low Risk  (05/10/2023)   Received from [facility]   Overall Financial Resource Strain (CARDIA)    Difficulty of Paying Living Expenses: Not hard at all  Food Insecurity: No Food Insecurity (05/10/2023)   Received from [facility]   Hunger Vital Sign    Within the past 12 months, you worried that your food would run out before you got the money to buy more.: Never true    Within the past 12 months, the food you bought just didn't last and you didn't have money to get more.: Never true  Transportation Needs: No Transportation Needs (05/10/2023)   Received from [facility]   PRAPARE - Transportation    Lack of Transportation (Medical): No    Lack of Transportation (Non-Medical): No  Physical Activity: Inactive (05/10/2023)   Received from [facility]   Exercise Vital Sign    On average, how many days per week do you engage in moderate to strenuous exercise (like a brisk walk)?: 0 days    On average, how many minutes do you engage in exercise at this level?: 0 min  Stress: No Stress Concern Present (05/10/2023)   Received from [facility] - Occupational Stress Questionnaire    Feeling of Stress : Only a little  Social Connections: Socially Integrated (05/10/2023)   Received from [facility]   Social Connection and Isolation Panel    In a typical week, how many times do you talk on the phone with family, friends, or neighbors?: More than three times a week    How often do you get together with friends or relatives?: More than three times a week    How often do you attend church or religious services?: More than 4 times per year    Do you belong to any clubs or organizations such as church groups, unions, fraternal or athletic groups, or school groups?: Yes    How often do you attend meetings of the clubs or organizations you belong to?: More than 4 times per year    Are you married, widowed,  divorced, separated, never married, or living with a partner?: Married     Family History: The patient's family history includes Anemia in her mother; Asthma in her daughter; Breast cancer in her mother; Diabetes in her brother, father, maternal aunt, maternal uncle, mother, paternal aunt, and paternal uncle; Heart disease in her mother; Hypertension in her father; Stroke in her father; Thyroid  disease in her mother.  ROS:   Please see the history of present illness.     All other systems reviewed and are negative.  EKGs/Labs/Other Studies Reviewed:    The following studies were reviewed today:   EKG:  07/21/2023: Sinus rhythm with frequent PVCs, rate 63  Recent Labs: 07/21/2023:  BUN 18; Creatinine, Ser 1.26; Magnesium  2.0; Potassium 4.4; Sodium 140; TSH 2.910  Recent Lipid Panel    Component Value Date/Time   CHOL 172 07/21/2023 1057   TRIG 141 07/21/2023 1057   HDL 42 07/21/2023 1057   CHOLHDL 4.1 07/21/2023 1057   CHOLHDL 3.8 02/20/2018 0534   VLDL 31 02/20/2018 0534   LDLCALC 105 (H) 07/21/2023 1057    Physical Exam:    VS:  LMP 03/11/2012     Wt Readings from Last 3 Encounters:  07/21/23 (!) 328 lb 6.4 oz (149 kg)  02/25/18 (!) 316 lb (143.3 kg)  02/19/18 (!) 315 lb (142.9 kg)     GEN:  Well nourished, well developed in no acute distress HEENT: Normal NECK: No JVD; No carotid bruits LYMPHATICS: No lymphadenopathy CARDIAC: RRR, no murmurs, rubs, gallops RESPIRATORY:  Clear to auscultation without rales, wheezing or rhonchi  ABDOMEN: Soft, non-tender, non-distended MUSCULOSKELETAL:  No edema; No deformity  SKIN: Warm and dry NEUROLOGIC:  Alert and oriented x 3 PSYCHIATRIC:  Normal affect   ASSESSMENT:    No diagnosis found.  PLAN:    Chest pain/DOE: Reports atypical chest pain but also having dyspnea with exertion that could represent anginal equivalent.  Echocardiogram 05/12/2023 showed EF greater than 55%, mild to moderate LVH, normal RV function, no  significant valvular disease.  Lexiscan  Myoview at [facility] 04/2023 showed moderate partially reversible apical anterior/septal and mid anteroseptal,  ischemia versus artifact (breast attenuation and motion artifact), EF 65%. Stress PET 10/26/2023 showed normal perfusion, normal myocardial blood flow reserve, LVEF 55%, no coronary calcifications.  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  She is reporting intermittent palpitations.  Check BMET, magnesium , TSH.  Plan Zio patch x 7 days to evaluate PVC burden***  Hypertension: On amlodipine  10 mg daily, bisoprolol-HCTZ 5-12.5 mg daily, hydralazine  50 mg 3 times daily, losartan  100 mg daily.  Appears controlled.  Suspect untreated OSA contributing to resistant hypertension, check sleep study.  Check BMET  Hyperlipidemia: On atorvastatin 40 mg daily.  LDL 105 on 07/21/2023  Morbid obesity: There is no height or weight on file to calculate BMI.  Diet/exercise recommended  T2DM: A1c 6.8%.  On glipizide  Snoring/daytime somnolence: Check [place] sleep study.  STOP-BANG 5***  RTC in 6 months   Medication Adjustments/Labs and Tests Ordered: Current medicines are reviewed at length with the patient today.  Concerns regarding medicines are outlined above.  No orders of the defined types were placed in this encounter.  No orders of the defined types were placed in this encounter.   There are no Patient Instructions on file for this visit.   Signed, Lonni LITTIE Nanas, MD  10/26/2023 6:44 PM    [facility]

## 2023-10-27 NOTE — Telephone Encounter (Signed)
 Ordering provider: Dr. Kate Associated diagnoses:  Snoring (R06.83) WatchPAT PA obtained on 10/27/2023 by Dena JAYSON Hesselbach, CMA. Authorization: No prior authorization was required per [facility].  Patient notified of PIN ([id number]) on 10/27/2023 via Notification Method: phone.  Phone note routed to covering staff for follow-up.

## 2023-10-28 ENCOUNTER — Ambulatory Visit: Payer: PRIVATE HEALTH INSURANCE | Admitting: Cardiology
